# Patient Record
Sex: Male | Born: 1990 | Race: White | Hispanic: No | Marital: Single | Smoking: Never smoker
Health system: Southern US, Community
[De-identification: ages and names within clinical notes are randomized; demographics above are authoritative.]

## PROBLEM LIST (undated history)

## (undated) DIAGNOSIS — F3181 Bipolar II disorder: Secondary | ICD-10-CM

---
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
## 2018-08-11 ENCOUNTER — Other Ambulatory Visit: Payer: Self-pay

## 2018-08-11 ENCOUNTER — Emergency Department (HOSPITAL_COMMUNITY)
Admission: EM | Admit: 2018-08-11 | Discharge: 2018-08-12 | Disposition: A | Discharge status: Other Acute Inpatient Hospital | Payer: Self-pay | Attending: Emergency Medicine | Admitting: Emergency Medicine

## 2018-08-11 ENCOUNTER — Encounter (HOSPITAL_COMMUNITY): Payer: Self-pay | Admitting: Emergency Medicine

## 2018-08-11 DIAGNOSIS — F314 Bipolar disorder, current episode depressed, severe, without psychotic features: Secondary | ICD-10-CM

## 2018-08-11 DIAGNOSIS — F3181 Bipolar II disorder: Secondary | ICD-10-CM | POA: Insufficient documentation

## 2018-08-11 DIAGNOSIS — Y908 Blood alcohol level of 240 mg/100 ml or more: Secondary | ICD-10-CM | POA: Insufficient documentation

## 2018-08-11 DIAGNOSIS — R45851 Suicidal ideations: Secondary | ICD-10-CM

## 2018-08-11 DIAGNOSIS — F10920 Alcohol use, unspecified with intoxication, uncomplicated: Secondary | ICD-10-CM

## 2018-08-11 HISTORY — DX: Bipolar II disorder: F31.81

## 2018-08-11 NOTE — ED Triage Notes (Signed)
Patient here with possible overdose.  He is very confrontational with nursing staff.  He was brought in by family friends.  He states he does not know why he was brought to the ED.  He does have ETOH on board at this time.  He states that he has taken his regular dosage of meds.

## 2018-08-11 NOTE — ED Notes (Signed)
Pts friends at bedside who brought patient to the ED handed this nurse a cell phone with 2 males on the line, the male friends state they have been in conversation with the patient for several weeks, pt has been upset for last few days re a new dx of Bipolar disorder and is having a very hard time with same. Pt called friends tonight repeated "I cant do this" multiple times to friends with casual mention of overdosing. Pt then hung up and they were not able to get back in touch with patient.  They got in touch with adults in home and subsequently found patient passed out on his bedroom floor having consumed 1 and a half double bottles of wine.  Count on Cymbalta and Seroquel in bottles correct.

## 2018-08-11 NOTE — ED Provider Notes (Signed)
[facility] EMERGENCY DEPARTMENT Provider Note   CSN: [id number] Arrival date & time: 08/11/18  2319    History   Chief Complaint Chief Complaint  Patient presents with  . Drug Overdose    HPI Wyatt Ward is a 28 y.o. male.   The history is provided by the patient.  Drug Overdose   He has a possible history of bipolar disorder and was brought in by family that he is staying with because they were concerned about him.  This past week, he had 1 day where he was excessively happy and talkative, and then got very depressed.  There is secondhand information that he had told some friends that he was going to take a lot of pills.  He does admit to drinking a bottle of wine today, and most days.  He denies any suicidal thoughts and so states that he has been taking medication which was prescribed for him.  He had also been taking doxylamine as a sleep aid and apparently had gone through a bottle of 90 over the past 3weeks.  He had been seen at [facility] where he was diagnosed as possible bipolar 2 disorder.  He denies any suicidal thoughts but states that everything is random and trying to make sense of it is mental suicide.  He denies hallucinations and denies paranoid ideation.  He denies illicit drug use.  The family who he has been staying with stating he did not feel comfortable taking him home.  They state they took him in about 3 weeks ago because he had lost his job and broken up with his girlfriend and seemed very down on himself at that time.  Past Medical History:  Diagnosis Date  . Bipolar 2 disorder (HCC)     There are no active problems to display for this patient.   ** The histories are not reviewed yet. Please review them in the "History" navigator section and refresh this SmartLink.      Home Medications    Prior to Admission medications   Not on File    Family History No family history on file.  Social History Social History   Tobacco Use    . Smoking status: Never Smoker  . Smokeless tobacco: Never Used  Substance Use Topics  . Alcohol use: Yes  . Drug use: Never     Allergies   Patient has no known allergies.   Review of Systems Review of Systems  All other systems reviewed and are negative.    Physical Exam Updated Vital Signs BP (!) 147/105   Pulse 92   Temp 97.6 F (36.4 C) (Oral)   Resp 17   Ht 5\' 9"  (1.753 m)   Wt 68 kg   SpO2 97%   BMI 22.15 kg/m   Physical Exam Vitals signs and nursing note reviewed.    28 year old male, resting comfortably and in no acute distress. Vital signs are significant for elevated blood pressure. Oxygen saturation is 97%, which is normal. Head is normocephalic and atraumatic. PERRLA, EOMI. Oropharynx is clear. Neck is nontender and supple without adenopathy or JVD. Back is nontender and there is no CVA tenderness. Lungs are clear without rales, wheezes, or rhonchi. Chest is nontender. Heart has regular rate and rhythm without murmur. Abdomen is soft, flat, nontender without masses or hepatosplenomegaly and peristalsis is normoactive. Extremities have no cyanosis or edema, full range of motion is present. Skin is warm and dry without rash. Neurologic: Awake and alert,  strong odor of ethanol on his breath and he does appear to be intoxicated, cranial nerves are intact, there are no motor or sensory deficits.  ED Treatments / Results  Labs (all labs ordered are listed, but only abnormal results are displayed) Labs Reviewed  COMPREHENSIVE METABOLIC PANEL - Abnormal; Notable for the following components:      Result Value   Glucose, Bld 115 (*)    All other components within normal limits  ACETAMINOPHEN LEVEL - Abnormal; Notable for the following components:   Acetaminophen (Tylenol), Serum <10 (*)    All other components within normal limits  ETHANOL - Abnormal; Notable for the following components:   Alcohol, Ethyl (B) 334 (*)    All other components within  normal limits  CBC WITH DIFFERENTIAL/PLATELET - Abnormal; Notable for the following components:   RDW 11.1 (*)    All other components within normal limits  SALICYLATE LEVEL  RAPID URINE DRUG SCREEN, HOSP PERFORMED    EKG EKG Interpretation  Date/Time:  Saturday August 11 2018 23:41:17 EST Ventricular Rate:  96 PR Interval:    QRS Duration: 105 QT Interval:  295 QTC Calculation: 373 R Axis:   127 Text Interpretation:  Sinus rhythm Prolonged PR interval Consider right atrial enlargement Right axis deviation RSR' in V1 or V2, probably normal variant Early repolarization Baseline wander No old tracing to compare Confirmed by Dione Booze ([id number]) on 08/12/2018 12:06:58 AM   Procedures Procedures  Medications Ordered in ED Medications  acetaminophen (TYLENOL) tablet 650 mg (has no administration in time range)  ondansetron (ZOFRAN) tablet 4 mg (has no administration in time range)  alum & mag hydroxide-simeth (MAALOX/MYLANTA) 200-200-20 MG/5ML suspension 30 mL (has no administration in time range)     Initial Impression / Assessment and Plan / ED Course  I have reviewed the triage vital signs and the nursing notes.  Pertinent labs & imaging results that were available during my care of the patient were reviewed by me and considered in my medical decision making (see chart for details).  Depression which is probably part of bipolar disorder.  Doubt significant overdose.  Screening labs are obtained and will need to obtain TTS consultation.  ECG shows no arrhythmia or varus widening.  Old records were reviewed, and he has no relevant past visits.  Labs are significant for elevated ethanol level.  TTS consultation is appreciated.  Patient meets criteria for inpatient care.  He is not willing to sign himself in, so IVC paperwork is filled out and submitted.  Final Clinical Impressions(s) / ED Diagnoses   Final diagnoses:  Suicidal ideation  Bipolar disorder, current episode  depressed, severe, without psychotic features (HCC)  Alcohol intoxication, uncomplicated [facility])    ED Discharge Orders    None       Dione Booze, MD 08/12/18 [phone]

## 2018-08-12 ENCOUNTER — Other Ambulatory Visit: Payer: Self-pay

## 2018-08-12 ENCOUNTER — Encounter (HOSPITAL_COMMUNITY): Payer: Self-pay | Admitting: *Deleted

## 2018-08-12 ENCOUNTER — Inpatient Hospital Stay (HOSPITAL_COMMUNITY)
Admission: AD | Admit: 2018-08-12 | Discharge: 2018-08-15 | DRG: 885 | Disposition: A | Discharge status: Home / Self Care | Payer: No Typology Code available for payment source | Source: Intra-hospital | Attending: Psychiatry | Admitting: Psychiatry

## 2018-08-12 DIAGNOSIS — F411 Generalized anxiety disorder: Secondary | ICD-10-CM

## 2018-08-12 DIAGNOSIS — Y908 Blood alcohol level of 240 mg/100 ml or more: Secondary | ICD-10-CM | POA: Diagnosis present

## 2018-08-12 DIAGNOSIS — G4700 Insomnia, unspecified: Secondary | ICD-10-CM | POA: Diagnosis present

## 2018-08-12 DIAGNOSIS — F1010 Alcohol abuse, uncomplicated: Secondary | ICD-10-CM | POA: Diagnosis present

## 2018-08-12 DIAGNOSIS — R45851 Suicidal ideations: Secondary | ICD-10-CM | POA: Diagnosis present

## 2018-08-12 DIAGNOSIS — F3181 Bipolar II disorder: Principal | ICD-10-CM

## 2018-08-12 DIAGNOSIS — Z818 Family history of other mental and behavioral disorders: Secondary | ICD-10-CM | POA: Diagnosis not present

## 2018-08-12 DIAGNOSIS — Z79899 Other long term (current) drug therapy: Secondary | ICD-10-CM | POA: Diagnosis not present

## 2018-08-12 DIAGNOSIS — F319 Bipolar disorder, unspecified: Secondary | ICD-10-CM | POA: Insufficient documentation

## 2018-08-12 LAB — CBC WITH DIFFERENTIAL/PLATELET
Abs Immature Granulocytes: 0.01 10*3/uL (ref 0.00–0.07)
BASOS ABS: 0.1 10*3/uL (ref 0.0–0.1)
BASOS PCT: 1 %
EOS ABS: 0.3 10*3/uL (ref 0.0–0.5)
EOS PCT: 7 %
HCT: 46.8 % (ref 39.0–52.0)
Hemoglobin: 16.1 g/dL (ref 13.0–17.0)
Immature Granulocytes: 0 %
Lymphocytes Relative: 41 %
Lymphs Abs: 2 10*3/uL (ref 0.7–4.0)
MCH: 31.7 pg (ref 26.0–34.0)
MCHC: 34.4 g/dL (ref 30.0–36.0)
MCV: 92.1 fL (ref 80.0–100.0)
Monocytes Absolute: 0.2 10*3/uL (ref 0.1–1.0)
Monocytes Relative: 5 %
NRBC: 0 % (ref 0.0–0.2)
Neutro Abs: 2.3 10*3/uL (ref 1.7–7.7)
Neutrophils Relative %: 46 %
PLATELETS: 225 10*3/uL (ref 150–400)
RBC: 5.08 MIL/uL (ref 4.22–5.81)
RDW: 11.1 % — AB (ref 11.5–15.5)
WBC: 4.9 10*3/uL (ref 4.0–10.5)

## 2018-08-12 LAB — RAPID URINE DRUG SCREEN, HOSP PERFORMED
Amphetamines: NOT DETECTED
BARBITURATES: NOT DETECTED
Benzodiazepines: NOT DETECTED
COCAINE: NOT DETECTED
OPIATES: NOT DETECTED
Tetrahydrocannabinol: NOT DETECTED

## 2018-08-12 LAB — COMPREHENSIVE METABOLIC PANEL
ALT: 18 U/L (ref 0–44)
AST: 31 U/L (ref 15–41)
Albumin: 4.6 g/dL (ref 3.5–5.0)
Alkaline Phosphatase: 63 U/L (ref 38–126)
Anion gap: 12 (ref 5–15)
BUN: 7 mg/dL (ref 6–20)
CO2: 26 mmol/L (ref 22–32)
Calcium: 9.1 mg/dL (ref 8.9–10.3)
Chloride: 105 mmol/L (ref 98–111)
Creatinine, Ser: 0.88 mg/dL (ref 0.61–1.24)
Glucose, Bld: 115 mg/dL — ABNORMAL HIGH (ref 70–99)
POTASSIUM: 4 mmol/L (ref 3.5–5.1)
SODIUM: 143 mmol/L (ref 135–145)
Total Bilirubin: 0.7 mg/dL (ref 0.3–1.2)
Total Protein: 7.4 g/dL (ref 6.5–8.1)

## 2018-08-12 LAB — ACETAMINOPHEN LEVEL

## 2018-08-12 LAB — ETHANOL: ALCOHOL ETHYL (B): 334 mg/dL — AB (ref <10)

## 2018-08-12 LAB — SALICYLATE LEVEL

## 2018-08-12 LAB — TSH: TSH: 2.551 u[IU]/mL (ref 0.350–4.500)

## 2018-08-12 MED ORDER — ONDANSETRON HCL 4 MG PO TABS: 4.0000 mg | ORAL_TABLET | Freq: Three times a day (TID) | ORAL | Status: DC | PRN

## 2018-08-12 MED ORDER — ZIPRASIDONE MESYLATE 20 MG IM SOLR: 20.0000 mg | Freq: Once | INTRAMUSCULAR | Status: DC

## 2018-08-12 MED ORDER — ALUM & MAG HYDROXIDE-SIMETH 200-200-20 MG/5ML PO SUSP: 30.0000 mL | ORAL | Status: DC | PRN

## 2018-08-12 MED ORDER — DULOXETINE HCL 60 MG PO CPEP
60.0000 mg | ORAL_CAPSULE | Freq: Every day | ORAL | Status: DC
  Administered 2018-08-13 – 2018-08-15 (×3): 60 mg via ORAL
  Filled 2018-08-12 (×4): qty 1
  Filled 2018-08-12: qty 6

## 2018-08-12 MED ORDER — ONDANSETRON 4 MG PO TBDP: 4.0000 mg | ORAL_TABLET | Freq: Four times a day (QID) | ORAL | Status: DC | PRN

## 2018-08-12 MED ORDER — MAGNESIUM HYDROXIDE 400 MG/5ML PO SUSP: 30.0000 mL | Freq: Every day | ORAL | Status: DC | PRN

## 2018-08-12 MED ORDER — ADULT MULTIVITAMIN W/MINERALS CH
1.0000 | ORAL_TABLET | Freq: Every day | ORAL | Status: DC
  Administered 2018-08-12 – 2018-08-15 (×4): 1 via ORAL
  Filled 2018-08-12 (×2): qty 1
  Filled 2018-08-12: qty 7
  Filled 2018-08-12 (×4): qty 1

## 2018-08-12 MED ORDER — ACETAMINOPHEN 325 MG PO TABS: 650.0000 mg | ORAL_TABLET | Freq: Four times a day (QID) | ORAL | Status: DC | PRN

## 2018-08-12 MED ORDER — ACETAMINOPHEN 325 MG PO TABS: 650.0000 mg | ORAL_TABLET | ORAL | Status: DC | PRN

## 2018-08-12 MED ORDER — QUETIAPINE FUMARATE 25 MG PO TABS: 25.0000 mg | ORAL_TABLET | Freq: Every day | ORAL | Status: DC

## 2018-08-12 MED ORDER — DULOXETINE HCL 60 MG PO CPEP
60.0000 mg | ORAL_CAPSULE | Freq: Every day | ORAL | Status: DC
  Administered 2018-08-12: 60 mg via ORAL
  Filled 2018-08-12: qty 1

## 2018-08-12 MED ORDER — CLONAZEPAM 0.5 MG PO TABS
0.5000 mg | ORAL_TABLET | Freq: Three times a day (TID) | ORAL | Status: DC | PRN
  Administered 2018-08-12 – 2018-08-13 (×3): 0.5 mg via ORAL
  Filled 2018-08-12 (×3): qty 1

## 2018-08-12 MED ORDER — HYDROXYZINE HCL 25 MG PO TABS
25.0000 mg | ORAL_TABLET | Freq: Three times a day (TID) | ORAL | Status: DC | PRN
  Administered 2018-08-13: 25 mg via ORAL
  Filled 2018-08-12: qty 1

## 2018-08-12 MED ORDER — ALUM & MAG HYDROXIDE-SIMETH 200-200-20 MG/5ML PO SUSP: 30.0000 mL | Freq: Four times a day (QID) | ORAL | Status: DC | PRN

## 2018-08-12 MED ORDER — LORAZEPAM 1 MG PO TABS: 1.0000 mg | ORAL_TABLET | Freq: Four times a day (QID) | ORAL | Status: DC | PRN

## 2018-08-12 MED ORDER — LORAZEPAM 1 MG PO TABS
1.0000 mg | ORAL_TABLET | Freq: Once | ORAL | Status: AC
Start: 2018-08-12 — End: 2018-08-12
  Administered 2018-08-12: 1 mg via ORAL
  Filled 2018-08-12: qty 1

## 2018-08-12 MED ORDER — QUETIAPINE FUMARATE 25 MG PO TABS
25.0000 mg | ORAL_TABLET | Freq: Every day | ORAL | Status: DC
  Administered 2018-08-12 – 2018-08-14 (×3): 25 mg via ORAL
  Filled 2018-08-12 (×2): qty 1
  Filled 2018-08-12: qty 7
  Filled 2018-08-12 (×2): qty 1

## 2018-08-12 MED ORDER — LOPERAMIDE HCL 2 MG PO CAPS: 2.0000 mg | ORAL_CAPSULE | ORAL | Status: DC | PRN

## 2018-08-12 NOTE — Progress Notes (Addendum)
Patient ID: Wyatt Ward, male   DOB: [birth year], 28 y.o.   MRN: [id number] Patient is a very anxious 28 yo [place] man who is admitted involuntarily to [facility] today- for the first time_ after being found unresponsive by his friends ( he got drunk and passed out, according to the patient)his BAL in ED was 334( after consuming 1 1/2 bottles of wine) . He reports he was recently diagnosed with bipolar, that  he has had a very hard time accepting this because according to the -patient " mental illness is such a stigma". At the time of admission, he presents almost tearful, explaining to this Clinical research associate that he never was suicidal, that he accepts he needs help but that he is more concerned and worried about being free to have telelphone interview tomorrow, with [employer]  in [place](  potentially strong employer - to -be ). He reports he has a strong history of mental illness in his family, that , to his knowledge, no one in his family has ever suicided but he is able to identify his mother as family that has experienced MI ( she has MDD) in the past. He is able to contract with this wirter for safety, he denies acitve SI and is oriented to the unit.

## 2018-08-12 NOTE — ED Notes (Signed)
Pt beginning to calm down. States "its not as bad as I thought it would be over here". Pt given sprite and graham crackers. Pt is asking for his Seroquel, but pt has already had his medication for tonight. Will text md for something for anxiety and sleep. Pt very grateful and apologized for his behavior while in the other pod. Pt did get family to text his girlfriend while he was talking to them and states that helped him calm down and feel a lot better. Girlfriend is in [place]. Pt lives in [place] also. Came here to spend time with family after recent dx of bipolar. Has been on new medication for less than a week.

## 2018-08-12 NOTE — ED Notes (Signed)
Pt moved to purple zone room 49. Dressed in purple scrubs. Security, EMT and 2 RN's escorted pt to this area. Pt very anxious and tearful. 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 Requesting to be discharged. Pt aware that he has been IVC'd and states "I don't want to be here" "I'm not suicidal" "I know I shouldn't have said those things" "I don't want to die!"  States that he will stay here and do whatever he has to do, as long as he can somehow do this interview on Monday. Family took all belongings home, including phone. Pt wants to call girlfriend, but does not know her number. Will call family and see about getting phone numbers off phone or them bringing phone here (to do the same) when they come to see pt later today. All questions answered.

## 2018-08-12 NOTE — ED Notes (Signed)
Pt's HR noted to be elevated - pt noted to be tearful and anxious. States "I just need to let the dr know that I am ok and I just need to talk to my girlfriend". Pt voiced understanding of tx plan - accepted to [facility]. Pt remains cooperative.

## 2018-08-12 NOTE — ED Notes (Signed)
Breakfast tray at bedside 

## 2018-08-12 NOTE — ED Notes (Addendum)
Called pts family, Kathlene November and Karle Plumber, on pts phone. They did answer. Wanted to stress the importance of pts interview on Monday and that if he misses the interview, how pt will hold it against everyone that tried to get him help tonight. Instructed family and pt that what they are asking is against the rules, but will mention it to dayshift RN. Pt asked if he could speak to family on the phone. Phone given to pt to speak with family. Pt states that he just wants to speak with the counselor again.

## 2018-08-12 NOTE — ED Notes (Signed)
Pt at nurses' desk attempting to make phone call. 

## 2018-08-12 NOTE — ED Notes (Signed)
Security wanded patient 

## 2018-08-12 NOTE — [facility] Suicide Risk Assessment (Signed)
[facility] Admission Suicide Risk Assessment   Nursing information obtained from:    Demographic factors:    Current Mental Status:    Loss Factors:    Historical Factors:    Risk Reduction Factors:     Total Time spent with patient: 45 minutes Principal Problem: <principal problem not specified> Diagnosis:  Active Problems:   Bipolar 1 disorder (HCC)  Subjective Data: Patient is seen and examined.  Patient is a 28 year old male with a past psychiatric history significant for major depression who presented to the [facility] emergency department on 08/12/2018 with suicidal ideation.  The patient had recently moved from [place] back to [place] and was staying with the parents of a friend of his from [place].  The patient had fallen on difficult times in [place].  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  He had been there for approximately 2 years.  He stated he enjoyed his job at first, but then got bored with that for the repetitious notes of it all.  He began to have some depression including helplessness, hopelessness and worthlessness.  He saw a psychiatrist initially in [place], but switched.  He had been given Lexapro early on, and felt as though it helped to some degree.  There was a suggestion of augmenting the Lexapro with Abilify.  He was very concerned about starting an antipsychotic medication, and was dissatisfied and switched doctors at that time.  He was started on Cymbalta but he is unsure of the dose he was on initially.  He is currently taking 60 mg a day.  He had been dating a male, and at one point had moved in together in October 2019.  Unfortunately about the same time that he moved into live with a girlfriend he lost his job at [employer].  Over the last several months he was unable to get a job, and his anxiety and depression  worsen.  In approximately December 2019 his girlfriend announced that she was going to break up with him.  That he would move out.  He arranged to come to [facility] to stay with his friend's parents given his lack of employment and resources.  He arrived to their home on approximately Super Bowl Sunday in February 2020.  He was still taking the Cymbalta at that time.  The family he was staying with noticed some mood swings behaviors and that he would go from depressed to euphoric over a day or 2.  A time.  He stated he had really not had issues with mood swings prior to most recently.  They became concerned enough that they took him to the local mental health center.  He underwent a evaluation there, and they suggested that he had bipolar disorder type II.  They started him on 50 mg of Seroquel at bedtime.  He stated that he had never had severe episodes of euphoria, but the the parents of his friend had noticed these things.  He denied any previous history of excessive spending, being awake for 2 3 days at a time, or severe increases in goal-directed activity.  After he had been told that he had bipolar disorder type II he became significantly despondent over this.  His girlfriend had contacted him, and they decided that all they really needed was a break from one another, and they were trying to reconcile.  He actually has an interview at [facility] in [place] via video chat on March 2.  Because of the upsetting nature of receiving the diagnosis he began to drink excessively in the home.  The parents he was staying with are significantly religious, and did not want alcohol in the home.  He had not drank excessively until he had met with the people at [facility].  They came to check on him last evening, and his door was locked.  He did not want them to know that he was drinking.  They eventually got in the room and found the alcohol and were very upset about this, and took him to the emergency room.  He  admitted that he had had suicidal thoughts, but it never harmed himself in the past.  He stated that things to just gotten worse after he was told he had bipolar disorder.  He initially took Seroquel was that prescribed, but the 50 mg dose "knocked me out".  He was planning on reducing it to 25 mg.  In the emergency room he denied suicidal ideation, but because the nature of the admission the decision was made to transfer him to our facility for evaluation and stabilization.  Currently significantly anxious.  He is mildly labile.  He denied suicidal ideation.  He denied any history of DUIs or other excessive alcohol use or drug use.  His parents live in [place], but they are unaware that he is in [place] currently.  They are unaware of the fact that he is currently hospitalized.  Continued Clinical Symptoms:    The "Alcohol Use Disorders Identification Test", Guidelines for Use in Primary Care, Second Edition.  World Pharmacologist [facility]). Score between 0-7:  no or low risk or alcohol related problems. Score between 8-15:  moderate risk of alcohol related problems. Score between 16-19:  high risk of alcohol related problems. Score 20 or above:  warrants further diagnostic evaluation for alcohol dependence and treatment.   CLINICAL FACTORS:   Severe Anxiety and/or Agitation Bipolar Disorder:   Bipolar II Alcohol/Substance Abuse/Dependencies   Musculoskeletal: Strength & Muscle Tone: within normal limits Gait & Station: normal Patient leans: N/A  Psychiatric Specialty Exam: Physical Exam  Nursing note and vitals reviewed. Constitutional: He is oriented to person, place, and time. He appears well-developed and well-nourished.  HENT:  Head: Normocephalic and atraumatic.  Respiratory: Effort normal.  Neurological: He is alert and oriented to person, place, and time.    ROS  There were no vitals taken for this visit.There is no height or weight on file to calculate BMI.  General  Appearance: Disheveled  Eye Contact:  Fair  Speech:  Pressured  Volume:  Normal  Mood:  Anxious  Affect:  Congruent  Thought Process:  Coherent and Descriptions of Associations: Intact  Orientation:  Full (Time, Place, and Person)  Thought Content:  Logical  Suicidal Thoughts:  No  Homicidal Thoughts:  No  Memory:  Immediate;   Good Recent;   Good Remote;   Good  Judgement:  Intact  Insight:  Fair  Psychomotor Activity:  Increased  Concentration:  Concentration: Fair and Attention Span: Fair  Recall:  [employer] of Knowledge:  Good  Language:  Good  Akathisia:  Negative  Handed:  Right  AIMS (if indicated):     Assets:  Communication Skills Desire for Improvement Housing Intimacy Leisure Time Physical Health  ADL's:  Intact  Cognition:  WNL  Sleep:  COGNITIVE FEATURES THAT CONTRIBUTE TO RISK:  None    SUICIDE RISK:   Minimal: No identifiable suicidal ideation.  Patients presenting with no risk factors but with morbid ruminations; may be classified as minimal risk based on the severity of the depressive symptoms  PLAN OF CARE: Patient is seen and examined.  Patient is a 28 year old male with the above-stated past psychiatric history who was admitted for concern of suicidality, excessive alcohol use and the potential for bipolar disorder.  He will be admitted to the hospital.  He will be integrated into the milieu.  He will be encouraged to attend groups and work on his coping skills.  He denied any history of alcohol withdrawal symptoms, and really any excessive alcohol use prior to the last several days.  I have written for clonazepam 0.5 mg p.o. 3 times daily as needed for anxiety, but this should also cover in case he starts to have any alcohol withdrawal symptoms.  He has endorsed episodes of irritability and mood swings consistent with bipolar 2, but when he took Seroquel it hit him pretty hard.  We will continue the Seroquel but only 25 mg a day.  We will also  continue his Cymbalta at 60 mg p.o. daily.  He denied any onset of mood swings or manic type symptoms after starting the Cymbalta.  His symptoms of any form of mania or hypomania started while he was at his friend's parents house.  He is mildly labile, but denies suicidality currently.  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  He is seeking to be discharged prior to that, and I have not agreed to that at least at this point.  We will need additional collateral information.  He has been tachycardic in the emergency room, but is significantly anxious.  His laboratories were all essentially normal including his MCV and liver function enzymes except for his blood alcohol being at 334.  We will order thyroid function studies to be complete.  His EKG was mildly abnormal, but was thought to be sinus rhythm.  I certify that inpatient services furnished can reasonably be expected to improve the patient's condition.   Sharma Covert, MD 08/12/2018, 1:35 PM

## 2018-08-12 NOTE — ED Notes (Signed)
Breakfast tray ordered 

## 2018-08-12 NOTE — H&P (Signed)
Psychiatric Admission Assessment Adult  Patient Identification: Wyatt Ward MRN:  [id number] Date of Evaluation:  08/12/2018 Chief Complaint:  Bipolar II disorder Principal Diagnosis: <principal problem not specified> Diagnosis:  Active Problems:   Bipolar 1 disorder ([place])  History of Present Illness: Patient is seen and examined.  Patient is a 28 year old male with a past psychiatric history significant for major depression who presented to the [facility] emergency department on 08/12/2018 with suicidal ideation.  The patient had recently moved from [place] back to [place] and was staying with the parents of a friend of his from [place].  The patient had fallen on difficult times in [place].  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  He had been there for approximately 2 years.  He stated he enjoyed his job at first, but then got bored with that for the repetitious notes of it all.  He began to have some depression including helplessness, hopelessness and worthlessness.  He saw a psychiatrist initially in [place], but switched.  He had been given Lexapro early on, and felt as though it helped to some degree.  There was a suggestion of augmenting the Lexapro with Abilify.  He was very concerned about starting an antipsychotic medication, and was dissatisfied and switched doctors at that time.  He was started on Cymbalta but he is unsure of the dose he was on initially.  He is currently taking 60 mg a day.  He had been dating a male, and at one point had moved in together in October 2019.  Unfortunately about the same time that he moved into live with a girlfriend he lost his job at [employer].  Over the last several months he was unable to get a job, and his anxiety and depression worsen.  In approximately December 2019 his girlfriend announced  that she was going to break up with him.  That he would move out.  He arranged to come to [facility] to stay with his friend's parents given his lack of employment and resources.  He arrived to their home on approximately Super Bowl Sunday in February 2020.  He was still taking the Cymbalta at that time.  The family he was staying with noticed some mood swings behaviors and that he would go from depressed to euphoric over a day or 2.  A time.  He stated he had really not had issues with mood swings prior to most recently.  They became concerned enough that they took him to the local mental health center.  He underwent a evaluation there, and they suggested that he had bipolar disorder type II.  They started him on 50 mg of Seroquel at bedtime.  He stated that he had never had severe episodes of euphoria, but the the parents of his friend had noticed these things.  He denied any previous history of excessive spending, being awake for 2 3 days at a time, or severe increases in goal-directed activity.  After he had been told that he had bipolar disorder type II he became significantly despondent over this.  His girlfriend had contacted him, and they decided that all they really needed was a break from one another, and they were trying to reconcile.  He actually has an interview at [facility] in [place] via video chat on March 2.  Because of the upsetting nature of  receiving the diagnosis he began to drink excessively in the home.  The parents he was staying with are significantly religious, and did not want alcohol in the home.  He had not drank excessively until he had met with the people at [facility].  They came to check on him last evening, and his door was locked.  He did not want them to know that he was drinking.  They eventually got in the room and found the alcohol and were very upset about this, and took him to the emergency room.  He admitted that he had had suicidal thoughts, but it never harmed  himself in the past.  He stated that things to just gotten worse after he was told he had bipolar disorder.  He initially took Seroquel was that prescribed, but the 50 mg dose "knocked me out".  He was planning on reducing it to 25 mg.  In the emergency room he denied suicidal ideation, but because the nature of the admission the decision was made to transfer him to our facility for evaluation and stabilization.  Currently significantly anxious.  He is mildly labile.  He denied suicidal ideation.  He denied any history of DUIs or other excessive alcohol use or drug use.  His parents live in [place], but they are unaware that he is in [place] currently.  They are unaware of the fact that he is currently hospitalized.  Associated Signs/Symptoms: Depression Symptoms:  depressed mood, anhedonia, insomnia, psychomotor agitation, fatigue, feelings of worthlessness/guilt, difficulty concentrating, hopelessness, suicidal thoughts without plan, anxiety, loss of energy/fatigue, disturbed sleep, (Hypo) Manic Symptoms:  Elevated Mood, Impulsivity, Irritable Mood, Labiality of Mood, Anxiety Symptoms:  Excessive Worry, Psychotic Symptoms:  denied PTSD Symptoms: Negative Total Time spent with patient: 1 hour  Past Psychiatric History: Patient has no previous psychiatric admissions.  He has seen a psychiatrist in [place].  He saw 2 people there.  He was previously prescribed Lexapro and Cymbalta.  He had been prescribed Abilify for mood stability, but never took it.  No previous suicide attempts.  No previous DUIs.  Is the patient at risk to self? No.  Has the patient been a risk to self in the past 6 months? No.  Has the patient been a risk to self within the distant past? No.  Is the patient a risk to others? No.  Has the patient been a risk to others in the past 6 months? No.  Has the patient been a risk to others within the distant past? No.   Prior Inpatient Therapy:   Prior Outpatient  Therapy:    Alcohol Screening:   Substance Abuse History in the last 12 months:  Yes.   Consequences of Substance Abuse: Medical Consequences:  Patient ended up in the emergency room this evening because of excessive alcohol use. Previous Psychotropic Medications: Yes  Psychological Evaluations: Yes  Past Medical History:  Past Medical History:  Diagnosis Date  . Bipolar 2 disorder ([place])    Family History: No family history on file. Family Psychiatric  History: Patient stated his mother has a history of depression. Tobacco Screening:   Social History:  Social History   Substance and Sexual Activity  Alcohol Use Yes     Social History   Substance and Sexual Activity  Drug Use Never    Additional Social History:                           Allergies:  No Known Allergies Lab Results:  Results for orders placed or performed during the hospital encounter of 08/11/18 (from the past 48 hour(s))  Urine rapid drug screen (hosp performed)     Status: None   Collection Time: 08/12/18 12:42 AM  Result Value Ref Range   Opiates NONE DETECTED NONE DETECTED   Cocaine NONE DETECTED NONE DETECTED   Benzodiazepines NONE DETECTED NONE DETECTED   Amphetamines NONE DETECTED NONE DETECTED   Tetrahydrocannabinol NONE DETECTED NONE DETECTED   Barbiturates NONE DETECTED NONE DETECTED    Comment: (NOTE) DRUG SCREEN FOR MEDICAL PURPOSES ONLY.  IF CONFIRMATION IS NEEDED FOR ANY PURPOSE, NOTIFY LAB WITHIN 5 DAYS. LOWEST DETECTABLE LIMITS FOR URINE DRUG SCREEN Drug Class                     Cutoff (ng/mL) Amphetamine and metabolites    1000 Barbiturate and metabolites    200 Benzodiazepine                 295 Tricyclics and metabolites     300 Opiates and metabolites        300 Cocaine and metabolites        300 THC                            50 Performed at [facility], [place] [address], [place], [place] [address]   Comprehensive metabolic panel     Status: Abnormal    Collection Time: 08/12/18 12:47 AM  Result Value Ref Range   Sodium 143 135 - 145 mmol/L   Potassium 4.0 3.5 - 5.1 mmol/L   Chloride 105 98 - 111 mmol/L   CO2 26 22 - 32 mmol/L   Glucose, Bld 115 (H) 70 - 99 mg/dL   BUN 7 6 - 20 mg/dL   Creatinine, Ser 0.88 0.61 - 1.24 mg/dL   Calcium 9.1 8.9 - 10.3 mg/dL   Total Protein 7.4 6.5 - 8.1 g/dL   Albumin 4.6 3.5 - 5.0 g/dL   AST 31 15 - 41 U/L   ALT 18 0 - 44 U/L   Alkaline Phosphatase 63 38 - 126 U/L   Total Bilirubin 0.7 0.3 - 1.2 mg/dL   GFR calc non Af Amer >60 >60 mL/min   GFR calc Af Amer >60 >60 mL/min   Anion gap 12 5 - 15    Comment: Performed at [facility] [address] [place], [place] [address]  Acetaminophen level     Status: Abnormal   Collection Time: 08/12/18 12:47 AM  Result Value Ref Range   Acetaminophen (Tylenol), Serum <10 (L) 10 - 30 ug/mL    Comment: Performed at [facility] [address] [place], [place] [address]  Salicylate level     Status: None   Collection Time: 08/12/18 12:47 AM  Result Value Ref Range   Salicylate Lvl <3.6 2.8 - 30.0 mg/dL    Comment: Performed at [facility] [address], [place], [place] [address]  Ethanol     Status: Abnormal   Collection Time: 08/12/18 12:47 AM  Result Value Ref Range   Alcohol, Ethyl (B) 334 (HH) <10 mg/dL    Comment: CRITICAL RESULT CALLED TO, READ BACK BY AND VERIFIED WITH: MUNNETT [facility] 08/12/18 0126 WAYK Performed at [facility], [place] [address] [place], [place] [address]   CBC with Differential  Status: Abnormal   Collection Time: 08/12/18 12:47 AM  Result Value Ref Range   WBC 4.9 4.0 - 10.5 K/uL   RBC 5.08 4.22 - 5.81 MIL/uL   Hemoglobin 16.1 13.0 - 17.0 g/dL   HCT 46.8 39.0 - 52.0 %   MCV 92.1 80.0 - 100.0 fL   MCH 31.7 26.0 - 34.0 pg   MCHC 34.4 30.0 - 36.0 g/dL   RDW 11.1 (L) 11.5 - 15.5 %   Platelets 225 150 - 400 K/uL   nRBC 0.0 0.0 - 0.2 %   Neutrophils Relative % 46 %   Neutro Abs 2.3 1.7 - 7.7  K/uL   Lymphocytes Relative 41 %   Lymphs Abs 2.0 0.7 - 4.0 K/uL   Monocytes Relative 5 %   Monocytes Absolute 0.2 0.1 - 1.0 K/uL   Eosinophils Relative 7 %   Eosinophils Absolute 0.3 0.0 - 0.5 K/uL   Basophils Relative 1 %   Basophils Absolute 0.1 0.0 - 0.1 K/uL   Immature Granulocytes 0 %   Abs Immature Granulocytes 0.01 0.00 - 0.07 K/uL    Comment: Performed at [facility], [address], [place], [place] [address]    Blood Alcohol level:  Lab Results  Component Value Date   ETH 334 [facility]) 75/03/2584    Metabolic Disorder Labs:  No results found for: HGBA1C, MPG No results found for: PROLACTIN No results found for: CHOL, TRIG, HDL, CHOLHDL, VLDL, LDLCALC  Current Medications: Current Facility-Administered Medications  Medication Dose Route Frequency Provider Last Rate Last Dose  . acetaminophen (TYLENOL) tablet 650 mg  650 mg Oral Q6H PRN Sharma Covert, MD      . alum & mag hydroxide-simeth (MAALOX/MYLANTA) 200-200-20 MG/5ML suspension 30 mL  30 mL Oral Q4H PRN Sharma Covert, MD      . clonazePAM Bobbye [place]) tablet 0.5 mg  0.5 mg Oral TID PRN Sharma Covert, MD   0.5 mg at 08/12/18 1337  . [START ON 08/13/2018] DULoxetine (CYMBALTA) DR capsule 60 mg  60 mg Oral Daily Sharma Covert, MD      . hydrOXYzine (ATARAX/VISTARIL) tablet 25 mg  25 mg Oral TID PRN Sharma Covert, MD      . loperamide (IMODIUM) capsule 2-4 mg  2-4 mg Oral PRN Sharma Covert, MD      . magnesium hydroxide (MILK OF MAGNESIA) suspension 30 mL  30 mL Oral Daily PRN Sharma Covert, MD      . multivitamin with minerals tablet 1 tablet  1 tablet Oral Daily Sharma Covert, MD   1 tablet at 08/12/18 1338  . ondansetron (ZOFRAN-ODT) disintegrating tablet 4 mg  4 mg Oral Q6H PRN Sharma Covert, MD      . QUEtiapine (SEROQUEL) tablet 25 mg  25 mg Oral QHS Sharma Covert, MD       PTA Medications: Medications Prior to Admission  Medication Sig Dispense Refill Last Dose   . DULoxetine (CYMBALTA) 60 MG capsule Take 60 mg by mouth daily.   08/11/2018 at am  . QUEtiapine (SEROQUEL) 50 MG tablet Take 50 mg by mouth at bedtime.   08/11/2018 at bedtime    Musculoskeletal: Strength & Muscle Tone: Ability to identify changes in lifestyle to reduce recurrence of condition will improve, Ability to verbalize feelings will improve, Ability to disclose and discuss suicidal ideas, Ability to demonstrate self-control will improve, Ability to identify and develop effective coping behaviors will improve and Ability to  maintain clinical measurements within normal limits will improve Gait & Station: normal Patient leans: N/A  Psychiatric Specialty Exam: Physical Exam  Nursing note and vitals reviewed. Constitutional: He is oriented to person, place, and time. He appears well-developed and well-nourished.  HENT:  Head: Normocephalic and atraumatic.  Respiratory: Effort normal.  Neurological: He is alert and oriented to person, place, and time.    ROS  There were no vitals taken for this visit.There is no height or weight on file to calculate BMI.  General Appearance: Disheveled  Eye Contact:  Good  Speech:  Pressured  Volume:  Normal  Mood:  Anxious  Affect:  Congruent  Thought Process:  Coherent and Descriptions of Associations: Intact  Orientation:  Full (Time, Place, and Person)  Thought Content:  Logical  Suicidal Thoughts:  No  Homicidal Thoughts:  No  Memory:  Immediate;   Good Recent;   Good Remote;   Good  Judgement:  Intact  Insight:  Fair  Psychomotor Activity:  Increased  Concentration:  Concentration: Fair and Attention Span: Fair  Recall:  Good  Fund of Knowledge:  Good  Language:  Good  Akathisia:  Negative  Handed:  Right  AIMS (if indicated):     Assets:  Communication Skills Desire for Improvement Housing Leisure Time Physical Health Resilience  ADL's:  Intact  Cognition:  WNL  Sleep:       Treatment Plan Summary: Daily contact  with patient to assess and evaluate symptoms and progress in treatment, Medication management and Plan Patient is seen and examined.  Patient is a 28 year old male with the above-stated past psychiatric history who was admitted for concern of suicidality, excessive alcohol use and the potential for bipolar disorder.  He will be admitted to the hospital.  He will be integrated into the milieu.  He will be encouraged to attend groups and work on his coping skills.  He denied any history of alcohol withdrawal symptoms, and really any excessive alcohol use prior to the last several days.  I have written for clonazepam 0.5 mg p.o. 3 times daily as needed for anxiety, but this should also cover in case he starts to have any alcohol withdrawal symptoms.  He has endorsed episodes of irritability and mood swings consistent with bipolar 2, but when he took Seroquel it hit him pretty hard.  We will continue the Seroquel but only 25 mg a day.  We will also continue his Cymbalta at 60 mg p.o. daily.  He denied any onset of mood swings or manic type symptoms after starting the Cymbalta.  His symptoms of any form of mania or hypomania started while he was at his friend's parents house.  He is mildly labile, but denies suicidality currently.  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  He is seeking to be discharged prior to that, and I have not agreed to that at least at this point.  We will need additional collateral information.  He has been tachycardic in the emergency room, but is significantly anxious.  His laboratories were all essentially normal including his MCV and liver function enzymes except for his blood alcohol being at 334.  We will order thyroid function studies to be complete.  His EKG was mildly abnormal, but was thought to be sinus rhythm.  Observation Level/Precautions:  15 minute checks  Laboratory:  Chemistry Profile  Psychotherapy:    Medications:    Consultations:     Discharge Concerns:  Estimated LOS:  Other:     Physician Treatment Plan for Primary Diagnosis: <principal problem not specified> Long Term Goal(s): Improvement in symptoms so as ready for discharge  Short Term Goals: Ability to identify changes in lifestyle to reduce recurrence of condition will improve, Ability to verbalize feelings will improve, Ability to disclose and discuss suicidal ideas, Ability to demonstrate self-control will improve, Ability to identify and develop effective coping behaviors will improve and Ability to maintain clinical measurements within normal limits will improve  Physician Treatment Plan for Secondary Diagnosis: Active Problems:   Bipolar 1 disorder ([place])  Long Term Goal(s): Improvement in symptoms so as ready for discharge  Short Term Goals: Ability to identify changes in lifestyle to reduce recurrence of condition will improve, Ability to verbalize feelings will improve, Ability to disclose and discuss suicidal ideas, Ability to demonstrate self-control will improve, Ability to identify and develop effective coping behaviors will improve and Ability to maintain clinical measurements within normal limits will improve  I certify that inpatient services furnished can reasonably be expected to improve the patient's condition.    Sharma Covert, MD 3/1/20201:49 PM

## 2018-08-12 NOTE — ED Notes (Addendum)
Pt insisting that he is not SI and wants to leave. Advised pt he is under IVC and [facility] is recommending Inpt. Pt is requesting to speak w/dr so "I can convince him that I don't need to stay". Advised pt of tx plan.

## 2018-08-12 NOTE — [facility] Group Notes (Signed)
Adult Psychoeducational Group Note  Date:  08/12/2018 Time:  9:28 PM  Group Topic/Focus:  Wrap-Up Group:   The focus of this group is to help patients review their daily goal of treatment and discuss progress on daily workbooks.  Participation Level:  Active  Participation Quality:  Appropriate and Attentive  Affect:  Appropriate  Cognitive:  Alert and Appropriate  Insight: Appropriate and Good  Engagement in Group:  Engaged  Modes of Intervention:  Discussion and Education  Additional Comments:  Pt attended and participated in wrap up group this evening. Pt rated their day an 8/10, due to them being IVC and reading a text of their girlfriend breaking up to them. Pt goal while they are here is to continue writing.   Chrisandra Netters 08/12/2018, 9:28 PM

## 2018-08-12 NOTE — ED Notes (Signed)
Pt has been accepted to [facility] [id number] - at 12pm.

## 2018-08-12 NOTE — Tx Team (Signed)
Initial Treatment Plan 08/12/2018 6:00 PM Lamareon Sport [id number]    PATIENT STRESSORS: Educational concerns Financial difficulties Health problems   PATIENT STRENGTHS: Ability for insight Active sense of humor Average or above average intelligence   PATIENT IDENTIFIED PROBLEMS: MDD with Bipolar D/o  Substance Abuse s/p OD attempt              "{ I really didn't want to die"  " I need to get out of here"   DISCHARGE CRITERIA:  Ability to meet basic life and health needs Adequate post-discharge living arrangements Improved stabilization in mood, thinking, and/or behavior  PRELIMINARY DISCHARGE PLAN: Attend aftercare/continuing care group Attend PHP/IOP Attend 12-step recovery group  PATIENT/FAMILY INVOLVEMENT: This treatment plan has been presented to and reviewed with the patient, Wyatt Ward, and/or family member, .  The patient and family have been given the opportunity to ask questions and make suggestions.  Rich Brave, RN 08/12/2018, 6:00 PM

## 2018-08-12 NOTE — ED Notes (Signed)
Lunch tray ordered at 0957. 

## 2018-08-12 NOTE — [facility] Group Notes (Signed)
[facility] Group Notes:  (Nursing/MHT/Case Management/Adjunct)  Date:  08/12/2018  Time:  6:35 PM  Type of Therapy:  Nurse Education  Participation Level:  Active  Participation Quality:  Attentive  Affect:  Appropriate  Cognitive:  Alert and Appropriate  Insight:  Good  Engagement in Group:  Engaged  Modes of Intervention:  Activity and Discussion  Summary of Progress/Problems: In this group, the RN reviewed healthy coping strategies. The patients were taught why they struggle with symptoms of depression and how to work through them. The patient was cooperative and participated.  Kirstie Mirza 08/12/2018, 6:35 PM

## 2018-08-12 NOTE — BH Assessment (Addendum)
Tele Assessment Note   Patient Name: Brom [place] MRN: [id number] Referring Physician: Dione Booze, MD Location of Patient: [facility], [id number] Location of Provider: Behavioral Health TTS Department  Ronith Raper is an 28 y.o. single male who presents to [facility] ED accompanied by two friends, Kathlene November and Karle Plumber, who participated in assessment with Pt's consent. Pt says "I feel great! I feel wonderful!" Pt says to ask the Tomasa Blase why he is here. The Tomasa Blase say that Pt has appeared depressed and has not been behaving normally for the past several days. The Tomasa Blase report that tonight they received a call from Pt's friends stating they were concerned because Pt told them repeatedly "I can't do this" and casually mentioned overdosing. Pt then hung up and they were not able to get back in touch with him. Ralene Cork checked on Pt and discovered Pt was "passed out" on his bedroom floor. He appeared to have ingested a bottle and a half of wine and there was an empty bottle of non-prescription sleep aid and an empty bottle of melatonin. Per ED notes, the Pt's RN spoke via cell phone with two male friends who stated they have been in conversation with the Pt for several weeks and Pt has been upset for last few days re a new dx of Bipolar disorder and is having a very hard time with same.Pt's blood alcohol level is 334.    Pt appears angry and defensive during assessment despite repeatedly saying he feels "wonderful". Pt states that he "loves religious people because they are closest to suicide." Pt denies being religious. He says he has a history of depression and was prescribed Cymbalta by a psychiatrist in [place]. Pt says he was evaluated by a psychiatric nurse practioner three days ago at [facility] who diagnosed him with bipolar disorder and prescribed Seroquel. Pt insists he is not bipolar and is offended he was given this diagnosis. Pt repeatedly denies he is suicidal or that he attempted suicide  tonight. Pt denies any history of suicide attempts. He denies current homicidal ideation or history of violence. He denies auditory or visual hallucinations. Pt states he typically drinks one bottle of wine 3-4 times per week. He denies other substance use. Pt's urine drug screen is negative.   Pt states he recently broke up with his girlfriend. 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 Pt says he also had to get rid of his dog and Pt became tearful and upset when discussing this. He came to [place] and the Tomasa Blase took him in. Karle Plumber states the Pt had not been eating and appeared depressed. She says Pt began avoiding them earlier this week and then began stating things that were contradictory or not making sense. She says Pt's friends expressed concern due to Pt's mood and behavior and they took Pt to [facility]. Pt denies any history of inpatient psychiatric treatment. Pt denies history of abuse. He denies access to firearms.  Pt is dressed in hospital gown, alert and oriented x4. Pt speaks in a clear tone, at moderate volume and normal pace. Motor behavior appears normal. Eye contact is good. Pt describes his mood as wonderful but affect is labile. Thought process is coherent and at times circumstantial. There is no indication Pt is currently responding to internal stimuli or experiencing delusional thought content. Pt states he wants to leave.  During assessment Pt insisted that he didn't trust the Tomasa Blase and was no longer going to stay  with them. He said he was going on vacation and then returning to [place], although he has no money. Ralene Cork told Pt he agreed to Pt's wishes and that Pt could no longer stay with them. Pt then insisted he never said he didn't want to stay with them. Pt will not be able to return to the [place] residence.   Diagnosis: F31.81 Bipolar II disorder  Past Medical History:  Past Medical History:  Diagnosis Date  . Bipolar 2 disorder  (HCC)       Family History: No family history on file.  Social History:  reports that he has never smoked. He has never used smokeless tobacco. He reports current alcohol use. He reports that he does not use drugs.  Additional Social History:  Alcohol / Drug Use Pain Medications: Denies use Prescriptions: Denies abuse Over the Counter: Pt taking OTC sleeping medications and Melatonin History of alcohol / drug use?: Yes Longest period of sobriety (when/how long): Unknown Negative Consequences of Use: (Pt denies) Withdrawal Symptoms: (Pt denies) Substance #1 Name of Substance 1: Alcohol 1 - Age of First Use: unknown 1 - Amount (size/oz): Approximately one bottle of wine 1 - Frequency: 3-4 times per week 1 - Duration: Ongoing 1 - Last Use / Amount: 08/11/18  CIWA: CIWA-Ar BP: (!) 141/99 Pulse Rate: (!) 105 COWS:    Allergies: No Known Allergies  Home Medications: (Not in a hospital admission)   OB/GYN Status:  No LMP for male patient.  General Assessment Data Location of Assessment: [facility] ED TTS Assessment: In system Is this a Tele or Face-to-Face Assessment?: Tele Assessment Is this an Initial Assessment or a Re-assessment for this encounter?: Initial Assessment Patient Accompanied by:: Other(Friends) Language Other than English: No Living Arrangements: Other (Comment)(Staying with friends) What gender do you identify as?: Male Marital status: Single Maiden name: NA Pregnancy Status: No Living Arrangements: Non-relatives/Friends Can pt return to current living arrangement?: Yes Admission Status: Voluntary Is patient capable of signing voluntary admission?: Yes Referral Source: Self/Family/Friend Insurance type: Self-pay     Crisis Care Plan Living Arrangements: Non-relatives/Friends Legal Guardian: Other:(Self) Name of Psychiatrist: Transport planner Name of Therapist: [facility]  Education Status Is patient currently in school?: No Is the patient employed, unemployed  or receiving disability?: Unemployed  Risk to self with the past 6 months Suicidal Ideation: Yes-Currently Present Has patient been a risk to self within the past 6 months prior to admission? : Yes Suicidal Intent: No Has patient had any suicidal intent within the past 6 months prior to admission? : No Is patient at risk for suicide?: Yes Suicidal Plan?: Yes-Currently Present Has patient had any suicidal plan within the past 6 months prior to admission? : Other (comment) Specify Current Suicidal Plan: Pt has possibly overdosed on alcohol and OTC sleeping medications Access to Means: Yes Specify Access to Suicidal Means: Empty bottles of OTC sleeping medications found by friends What has been your use of drugs/alcohol within the last 12 months?: Pt reports drinking one bottle of win 3-4 times per week Previous Attempts/Gestures: No How many times?: 0 Other Self Harm Risks: None Triggers for Past Attempts: None known Intentional Self Injurious Behavior: None Family Suicide History: No Recent stressful life event(s): Financial Problems, Job Loss, Loss (Comment), Other (Comment)(Broke up with girlfriend, had to give up dog) Persecutory voices/beliefs?: No Depression: Yes Depression Symptoms: Insomnia, Tearfulness, Isolating, Feeling angry/irritable Substance abuse history and/or treatment for substance abuse?: No Suicide prevention information given to non-admitted patients: Not applicable  Risk  to Others within the past 6 months Homicidal Ideation: No Does patient have any lifetime risk of violence toward others beyond the six months prior to admission? : No Thoughts of Harm to Others: No Current Homicidal Intent: No Current Homicidal Plan: No Access to Homicidal Means: No Identified Victim: None History of harm to others?: No Assessment of Violence: None Noted Violent Behavior Description: Pt denies history of violence Does patient have access to weapons?: No Criminal Charges  Pending?: No Does patient have a court date: No Is patient on probation?: No  Psychosis Hallucinations: None noted Delusions: None noted  Mental Status Report Appearance/Hygiene: In scrubs Eye Contact: Good Motor Activity: Freedom of movement Speech: Logical/coherent Level of Consciousness: Alert Mood: Depressed, Anxious, Irritable, Other (Comment)(Pt says he feels "wonderful") Affect: Labile Anxiety Level: Minimal Thought Processes: Coherent, Relevant Judgement: Partial Orientation: Person, Place, Time, Situation Obsessive Compulsive Thoughts/Behaviors: None  Cognitive Functioning Concentration: Normal Memory: Recent Intact, Remote Intact Is patient IDD: No Insight: Poor Impulse Control: Fair Appetite: Fair Have you had any weight changes? : No Change Sleep: Decreased Total Hours of Sleep: 8(with medication) Vegetative Symptoms: None  ADLScreening [facility] Assessment Services) Patient's cognitive ability adequate to safely complete daily activities?: Yes Patient able to express need for assistance with ADLs?: Yes Independently performs ADLs?: Yes (appropriate for developmental age)  Prior Inpatient Therapy Prior Inpatient Therapy: No  Prior Outpatient Therapy Prior Outpatient Therapy: Yes Prior Therapy Dates: Current Prior Therapy Facilty/Provider(s): [facility] Reason for Treatment: Bipolar disorder Does patient have an ACCT team?: No Does patient have Intensive In-House Services?  : No Does patient have [facility] services? : Yes  ADL Screening (condition at time of admission) Patient's cognitive ability adequate to safely complete daily activities?: Yes Is the patient deaf or have difficulty hearing?: No Does the patient have difficulty seeing, even when wearing glasses/contacts?: No Does the patient have difficulty concentrating, remembering, or making decisions?: No Patient able to express need for assistance with ADLs?: Yes Does the patient have difficulty  dressing or bathing?: No Independently performs ADLs?: Yes (appropriate for developmental age) Does the patient have difficulty walking or climbing stairs?: No Weakness of Legs: None Weakness of Arms/Hands: None  Home Assistive Devices/Equipment Home Assistive Devices/Equipment: None    Abuse/Neglect Assessment (Assessment to be complete while patient is alone) Abuse/Neglect Assessment Can Be Completed: Yes Physical Abuse: Denies Verbal Abuse: Denies Sexual Abuse: Denies Exploitation of patient/patient's resources: Denies Self-Neglect: Denies     Merchant navy officer (For Healthcare) Does Patient Have a Medical Advance Directive?: No Would patient like information on creating a medical advance directive?: No - Patient declined          Disposition: Gave clinical report to Nira Conn, FNP who said Pt meets criteria for inpatient psychiatric treatment and accepted Pt to [facility]. Binnie Rail, [facility], said a bed is not currently available and the day shift AC will call [facility] when the bed is available. Notified Dr. Dione Booze and RN of recommendation. Dr. Preston Fleeting said he is petitioning for involuntary commitment.  Disposition Initial Assessment Completed for this Encounter: Yes  This service was provided via telemedicine using a 2-way, interactive audio and video technology.  Names of all persons participating in this telemedicine service and their role in this encounter. Name: [facility] Role: Patient  Name: Kathlene November and Karle Plumber Role: Friends  Name: Shela Commons, [facility] Role: TTS counselor      Harlin Rain Patsy Baltimore, [facility], [facility], [facility] Triage Specialist [phone]  Patsy Baltimore, Harlin Rain 08/12/2018 1:13 AM

## 2018-08-13 LAB — LIPID PANEL
Cholesterol: 203 mg/dL — ABNORMAL HIGH (ref 0–200)
HDL: 91 mg/dL (ref >40)
LDL Cholesterol: 98 mg/dL (ref 0–99)
Total CHOL/HDL Ratio: 2.2 RATIO
Triglycerides: 69 mg/dL (ref <150)
VLDL: 14 mg/dL (ref 0–40)

## 2018-08-13 LAB — HEMOGLOBIN A1C
HEMOGLOBIN A1C: 4.6 % — AB (ref 4.8–5.6)
Mean Plasma Glucose: 85.32 mg/dL

## 2018-08-13 MED ORDER — PROPRANOLOL HCL 10 MG PO TABS
10.0000 mg | ORAL_TABLET | Freq: Three times a day (TID) | ORAL | Status: DC
  Administered 2018-08-13 – 2018-08-15 (×6): 10 mg via ORAL
  Filled 2018-08-13: qty 21
  Filled 2018-08-13 (×2): qty 1
  Filled 2018-08-13: qty 21
  Filled 2018-08-13 (×9): qty 1
  Filled 2018-08-13: qty 21

## 2018-08-13 MED ORDER — CLONAZEPAM 1 MG PO TABS
1.0000 mg | ORAL_TABLET | Freq: Three times a day (TID) | ORAL | Status: DC | PRN
  Administered 2018-08-13 – 2018-08-15 (×6): 1 mg via ORAL
  Filled 2018-08-13 (×6): qty 1

## 2018-08-13 MED ORDER — OXCARBAZEPINE 150 MG PO TABS
75.0000 mg | ORAL_TABLET | Freq: Two times a day (BID) | ORAL | Status: DC
  Administered 2018-08-13 – 2018-08-15 (×5): 75 mg via ORAL
  Filled 2018-08-13 (×4): qty 0.5
  Filled 2018-08-13: qty 7
  Filled 2018-08-13 (×2): qty 0.5
  Filled 2018-08-13: qty 7
  Filled 2018-08-13: qty 0.5

## 2018-08-13 NOTE — Progress Notes (Signed)
D   Pt requested medications early this evening    He attended group and interacts minimally but appropriately with others    Pt voiced no further complaints at this time A    Verbal support given   Medications administered and effectiveness monitored  Q 15 min checks R   Pt is safe at this time

## 2018-08-13 NOTE — Progress Notes (Signed)
Patient ID: Wyatt Ward, male   DOB: 17-May-1991, 28 y.o.   MRN: [id number]  Primary nurse Drenda Freeze altered writer this patient was upset because his sister was in the waiting room with his personal journal which she had read. Patient tearful and visibly anxious stating, "it's a huge invasion of my privacy, it has personal stuff in there I don't want my parents knowing about". Patient states, "I am only supposed to date Muslims and they're going to find out I've dated others". Patient requested writer retrieve the journal from his sister in the lobby.  Upon retrieving the journal, patient's sister stated she was concerned because the journal "spoke in great detail about his suicidal thoughts" and she states she is concerned the patient is "not disclosing everything to the doctor". Sister states, "he said he was in over $40,000 in debt and even the writing style would change and get very erratic". Sister asks Clinical research associate to pass this on to treatment team.  Patient adamantly states he does not want staff to read his journal. Clinical research associate and primary nurse informed patient we would secure his journal into a locker for the length of his admission and he could have it upon discharge. Patient verbalized understanding. Journal placed into locker #7 and secured by security.

## 2018-08-13 NOTE — Progress Notes (Signed)
D:  Patient's self inventory sheet, patient sleeps good, no sleep medication.  Good appetite, low energy level, poor concentration. Rated depression, anxiety and hopeless #6.  Denied withdrawals.  Then checked runny nose . Denied SI.   Does have sinus cold.  Worst pain #9 in past 24 hours.  No pain medicine.  Goal is call her MD to reschedule appointment.   No discharge plans. A:  Medications administered per MD orders.  Emotional support and encouragement given patient. R:  Denied SI and HI, contracts for safety.  Denied A/V hallucinations.  Safety maintained with 15 minute checks.

## 2018-08-13 NOTE — Plan of Care (Signed)
Nurse discussed anxiety, depression, coping skills with patient. 

## 2018-08-13 NOTE — Progress Notes (Signed)
Patient's sister brought his private journal with his private thoughts and feelings and wanted to show staff what her brother had written.  Patient stated he did not want sister to read his journal and did not want journal read by staff.  Journal was given to nurse and placed in locker 7 for patient to get at discharge.   Patient's dad would like to be called tomorrow by SW/MD and discuss discharge date and where he will live after discharge at [facility].   Dad Hilmo Dyke phone [phone].

## 2018-08-13 NOTE — Progress Notes (Signed)
D: Patient observed up and active in milieu tonight. Spoke with MHT, RN about need for discharge tomorrow so that he can make his job interview via Nurse, adult. Patient noticeably anxious with pressured speech. Focused on discharge and does not verbalize insight into his current state and events PTA. Patient's affect animated, anxious, restless with congruent mood. Denies pain, physical complaints. Denies withdrawal, CIWA scored at a "7" due to agitation, anxiety. BP and pulse elevated (see doc flowsheets).    A: Medicated per orders, prn klonopin given for anxiety. Medication education provided. Erlene Quan, NP notified of VS and no new orders received. Explained to patient he would need to speak with the MD tomorrow regarding his desire to discharge. Level III obs in place for safety. Emotional support offered. Patient encouraged to complete Suicide Safety Plan before discharge (even though patient states he was not suicidal). Encouraged to attend and participate in unit programming.   R: Patient verbalizes understanding of POC. On reassess, patient slightly calmer, socializing with peers in dayroom. Patient denies SI/HI/AVH and remains safe on level III obs. Will continue to monitor throughout the night.

## 2018-08-13 NOTE — BHH Group Notes (Signed)
LCSW Group Therapy Note 08/13/2018 12:55 PM  Type of Therapy and Topic: Group Therapy: Overcoming Obstacles  Participation Level: Minimal  Description of Group:  In this group patients will be encouraged to explore what they see as obstacles to their own wellness and recovery. They will be guided to discuss their thoughts, feelings, and behaviors related to these obstacles. The group will process together ways to cope with barriers, with attention given to specific choices patients can make. Each patient will be challenged to identify changes they are motivated to make in order to overcome their obstacles. This group will be process-oriented, with patients participating in exploration of their own experiences as well as giving and receiving support and challenge from other group members.  Therapeutic Goals: 1. Patient will identify personal and current obstacles as they relate to admission. 2. Patient will identify barriers that currently interfere with their wellness or overcoming obstacles.  3. Patient will identify feelings, thought process and behaviors related to these barriers. 4. Patient will identify two changes they are willing to make to overcome these obstacles:   Summary of Patient Progress  Patient came to the group session during the last 5 minutes due to being with the MD. Wyatt Ward was attentive during his stay.    Therapeutic Modalities:  Cognitive Behavioral Therapy Solution Focused Therapy Motivational Interviewing Relapse Prevention Therapy   Alcario Drought Clinical Social Worker

## 2018-08-13 NOTE — Progress Notes (Signed)
Patient expressed in group that he wrote a great deal today in his journal. He also expressed that he spoke with his peers. He states that he had a good visit with his parents, but his brother cried.

## 2018-08-13 NOTE — Tx Team (Signed)
Interdisciplinary Treatment and Diagnostic Plan Update  08/13/2018 Time of Session: 9:30am Wyatt Ward MRN: [id number]  Principal Diagnosis: Bipolar 2 disorder [facility])  Secondary Diagnoses: Principal Problem:   Bipolar 2 disorder (HCC) Active Problems:   Alcohol abuse   Generalized anxiety disorder   Current Medications:  Current Facility-Administered Medications  Medication Dose Route Frequency Provider Last Rate Last Dose  . acetaminophen (TYLENOL) tablet 650 mg  650 mg Oral Q6H PRN Antonieta Pert, MD      . alum & mag hydroxide-simeth (MAALOX/MYLANTA) 200-200-20 MG/5ML suspension 30 mL  30 mL Oral Q4H PRN Antonieta Pert, MD      . clonazePAM Scarlette Calico) tablet 0.5 mg  0.5 mg Oral TID PRN Antonieta Pert, MD   0.5 mg at 08/12/18 2056  . DULoxetine (CYMBALTA) DR capsule 60 mg  60 mg Oral Daily Antonieta Pert, MD   60 mg at 08/13/18 0814  . hydrOXYzine (ATARAX/VISTARIL) tablet 25 mg  25 mg Oral TID PRN Antonieta Pert, MD   25 mg at 08/13/18 0739  . loperamide (IMODIUM) capsule 2-4 mg  2-4 mg Oral PRN Antonieta Pert, MD      . magnesium hydroxide (MILK OF MAGNESIA) suspension 30 mL  30 mL Oral Daily PRN Antonieta Pert, MD      . multivitamin with minerals tablet 1 tablet  1 tablet Oral Daily Antonieta Pert, MD   1 tablet at 08/13/18 [phone]  . ondansetron (ZOFRAN-ODT) disintegrating tablet 4 mg  4 mg Oral Q6H PRN Antonieta Pert, MD      . OXcarbazepine (TRILEPTAL) tablet 75 mg  75 mg Oral BID Antonieta Pert, MD   75 mg at 08/13/18 0936  . QUEtiapine (SEROQUEL) tablet 25 mg  25 mg Oral QHS Antonieta Pert, MD   25 mg at 08/12/18 2056   PTA Medications: Medications Prior to Admission  Medication Sig Dispense Refill Last Dose  . DULoxetine (CYMBALTA) 60 MG capsule Take 60 mg by mouth daily.   Unknown at Unknown time  . QUEtiapine (SEROQUEL) 50 MG tablet Take 50 mg by mouth at bedtime.   Unknown at Unknown time    Patient Stressors: Educational  concerns Financial difficulties Health problems  Patient Strengths: Ability for insight Active sense of humor Average or above average intelligence  Treatment Modalities: Medication Management, Group therapy, Case management,  1 to 1 session with clinician, Psychoeducation, Recreational therapy.   Physician Treatment Plan for Primary Diagnosis: Bipolar 2 disorder (HCC) Long Term Goal(s): Improvement in symptoms so as ready for discharge Improvement in symptoms so as ready for discharge   Short Term Goals: Ability to identify changes in lifestyle to reduce recurrence of condition will improve Ability to verbalize feelings will improve Ability to disclose and discuss suicidal ideas Ability to demonstrate self-control will improve Ability to identify and develop effective coping behaviors will improve Ability to maintain clinical measurements within normal limits will improve Ability to identify changes in lifestyle to reduce recurrence of condition will improve Ability to verbalize feelings will improve Ability to disclose and discuss suicidal ideas Ability to demonstrate self-control will improve Ability to identify and develop effective coping behaviors will improve Ability to maintain clinical measurements within normal limits will improve  Medication Management: Evaluate patient's response, side effects, and tolerance of medication regimen.  Therapeutic Interventions: 1 to 1 sessions, Unit Group sessions and Medication administration.  Evaluation of Outcomes: Progressing  Physician Treatment Plan for Secondary Diagnosis: Principal Problem:  Bipolar 2 disorder (HCC) Active Problems:   Alcohol abuse   Generalized anxiety disorder  Long Term Goal(s): Improvement in symptoms so as ready for discharge Improvement in symptoms so as ready for discharge   Short Term Goals: Ability to identify changes in lifestyle to reduce recurrence of condition will improve Ability to  verbalize feelings will improve Ability to disclose and discuss suicidal ideas Ability to demonstrate self-control will improve Ability to identify and develop effective coping behaviors will improve Ability to maintain clinical measurements within normal limits will improve Ability to identify changes in lifestyle to reduce recurrence of condition will improve Ability to verbalize feelings will improve Ability to disclose and discuss suicidal ideas Ability to demonstrate self-control will improve Ability to identify and develop effective coping behaviors will improve Ability to maintain clinical measurements within normal limits will improve     Medication Management: Evaluate patient's response, side effects, and tolerance of medication regimen.  Therapeutic Interventions: 1 to 1 sessions, Unit Group sessions and Medication administration.  Evaluation of Outcomes: Progressing   RN Treatment Plan for Primary Diagnosis: Bipolar 2 disorder (HCC) Long Term Goal(s): Knowledge of disease and therapeutic regimen to maintain health will improve  Short Term Goals: Ability to participate in decision making will improve, Ability to verbalize feelings will improve, Ability to disclose and discuss suicidal ideas and Ability to identify and develop effective coping behaviors will improve  Medication Management: RN will administer medications as ordered by provider, will assess and evaluate patient's response and provide education to patient for prescribed medication. RN will report any adverse and/or side effects to prescribing provider.  Therapeutic Interventions: 1 on 1 counseling sessions, Psychoeducation, Medication administration, Evaluate responses to treatment, Monitor vital signs and CBGs as ordered, Perform/monitor CIWA, COWS, AIMS and Fall Risk screenings as ordered, Perform wound care treatments as ordered.  Evaluation of Outcomes: Progressing   LCSW Treatment Plan for Primary  Diagnosis: Bipolar 2 disorder (HCC) Long Term Goal(s): Safe transition to appropriate next level of care at discharge, Engage patient in therapeutic group addressing interpersonal concerns.  Short Term Goals: Engage patient in aftercare planning with referrals and resources  Therapeutic Interventions: Assess for all discharge needs, 1 to 1 time with Social worker, Explore available resources and support systems, Assess for adequacy in community support network, Educate family and significant other(s) on suicide prevention, Complete Psychosocial Assessment, Interpersonal group therapy.  Evaluation of Outcomes: Progressing   Progress in Treatment: Attending groups: Yes. Participating in groups: Yes. Taking medication as prescribed: Yes. Toleration medication: Yes. Family/Significant other contact made: No, will contact:  the patient's sister Patient understands diagnosis: Yes. Discussing patient identified problems/goals with staff: Yes. Medical problems stabilized or resolved: Yes. Denies suicidal/homicidal ideation: Yes. Issues/concerns per patient self-inventory: No. Other:    New problem(s) identified: None   New Short Term/Long Term Goal(s): medication stabilization, elimination of SI thoughts, development of comprehensive mental wellness plan.    Patient Goals:  "To get better enough to leave"   Discharge Plan or Barriers: Patient reports he is discharging home to his parents home in [place], [place]. He reports he will continue to [facility] for outpatient medication management and therapy services. CSW will continue to follow for any additional referrals or discharge planning.   Reason for Continuation of Hospitalization: Anxiety Depression Medication stabilization  Estimated Length of Stay: 08/15/2018  Attendees: Patient: Wyatt Ward  08/13/2018 11:12 AM  Physician: Dr. Landry MellowGreg Clary, MD 08/13/2018 11:12 AM  Nursing: Meriam [email or web address], RN 08/13/2018 11:12 AM  RN Care  Manager: 08/13/2018 11:12 AM  Social Worker: Baldo Daub, LCSWA 08/13/2018 11:12 AM  Recreational Therapist:  08/13/2018 11:12 AM  Other: Marciano Sequin, NP  08/13/2018 11:12 AM  Other:  08/13/2018 11:12 AM  Other: 08/13/2018 11:12 AM    Scribe for Treatment Team: Maeola Sarah, LCSWA 08/13/2018 11:12 AM

## 2018-08-13 NOTE — [facility] Suicide Risk Assessment (Signed)
[facility] INPATIENT:  Family/Significant Other Suicide Prevention Education  Suicide Prevention Education:  Education Completed; with sister, Demante Breining [phone]) has been identified by the patient as the family member/significant other with whom the patient will be residing, and identified as the person(s) who will aid the patient in the event of a mental health crisis (suicidal ideations/suicide attempt).  With written consent from the patient, the family member/significant other has been provided the following suicide prevention education, prior to the and/or following the discharge of the patient.  The suicide prevention education provided includes the following:  Suicide risk factors  Suicide prevention and interventions  National Suicide Hotline telephone number  [facility] assessment telephone number  [facility] Emergency Assistance 911  [facility] and/or Residential Mobile Crisis Unit telephone number  Request made of family/significant other to:  Remove weapons (e.g., guns, rifles, knives), all items previously/currently identified as safety concern.    Remove drugs/medications (over-the-counter, prescriptions, illicit drugs), all items previously/currently identified as a safety concern.  The family member/significant other verbalizes understanding of the suicide prevention education information provided.  The family member/significant other agrees to remove the items of safety concern listed above.  Patient's sister reports that the patient has agreed to discharge home with his parents, here in [place], [place]. Patient's sister states that she and the patient's parents do not agree with the patient returning to [place], and would like to for him to remain in [place] with them.   Patient's sister states that she is concerned that the patient is not being forthcoming and truthful. She states that the patient knows what to say and that he continues to  be focused on a tele-interview he has in [place]. She also shared her concerns for the patient returning to [place], stating that he will be triggered if he returns there. Patient's sister asked to be informed if there were any major changes in the patient's treatment plan or discharge planning.    Maeola Sarah 08/13/2018, 1:16 PM

## 2018-08-13 NOTE — Progress Notes (Signed)
[facility] MD Progress Note  08/13/2018 11:33 AM Wyatt Ward  MRN:  [id number] Subjective:  Patient is a 28 year old male with a past psychiatric history significant for major depression who presented to the [facility] emergency department on 08/12/2018 with suicidal ideation.   Objective: Patient is seen and examined.  Patient is a 28 year old male with a past psychiatric history significant for depression and anxiety, but concerning for possible bipolar disorder.  He is seen in follow-up.  He is a little bit calmer today.  Did sleep last night with the Seroquel.  He still is a bit pressured.  He still psychomotor agitated.  It is difficult at this point to assess whether it is due to anxiety or lability.  He has excepted staying in the hospital a little longer.  He denied any side effects to his current medications.  He denied any suicidal ideation.  He continues to have mild tachycardia and elevated blood pressure.  He did sleep well last night with the trazodone and the Seroquel.  He did state that he talk to his sister, and he thinks that she will discuss the situation with his parents.  Principal Problem: Bipolar 2 disorder (HCC) Diagnosis: Principal Problem:   Bipolar 2 disorder (HCC) Active Problems:   Generalized anxiety disorder   Alcohol abuse  Total Time spent with patient: 15 minutes  Past Psychiatric History: See admission H&P  Past Medical History:  Past Medical History:  Diagnosis Date  . Bipolar 2 disorder (HCC)    History reviewed. No pertinent surgical history. Family History: History reviewed. No pertinent family history. Family Psychiatric  History: See admission H&P Social History:  Social History   Substance and Sexual Activity  Alcohol Use Yes     Social History   Substance and Sexual Activity  Drug Use Never    Social History   Socioeconomic History  . Marital status: Single    Spouse name: Not on file  . Number of children: Not on file   . Years of education: Not on file  . Highest education level: Not on file  Occupational History  . Not on file  Social Needs  . Financial resource strain: Not on file  . Food insecurity:    Worry: Not on file    Inability: Not on file  . Transportation needs:    Medical: Not on file    Non-medical: Not on file  Tobacco Use  . Smoking status: Never Smoker  . Smokeless tobacco: Never Used  Substance and Sexual Activity  . Alcohol use: Yes  . Drug use: Never  . Sexual activity: Not on file  Lifestyle  . Physical activity:    Days per week: Not on file    Minutes per session: Not on file  . Stress: Not on file  Relationships  . Social connections:    Talks on phone: Not on file    Gets together: Not on file    Attends religious service: Not on file    Active member of club or organization: Not on file    Attends meetings of clubs or organizations: Not on file    Relationship status: Not on file  Other Topics Concern  . Not on file  Social History Narrative  . Not on file   Additional Social History:    Pain Medications: Denies use Prescriptions: Denies abuse Over the Counter: Pt taking OTC sleeping medications and Melatonin History of alcohol / drug use?: Yes Longest period of sobriety (  when/how long): Unknown Name of Substance 1: Alcohol 1 - Age of First Use: unknown 1 - Amount (size/oz): Approximately one bottle of wine 1 - Frequency: 3-4 times per week 1 - Duration: Ongoing 1 - Last Use / Amount: 08/11/18                  Sleep: Good  Appetite:  Fair  Current Medications: Current Facility-Administered Medications  Medication Dose Route Frequency Provider Last Rate Last Dose  . acetaminophen (TYLENOL) tablet 650 mg  650 mg Oral Q6H PRN Antonieta Pert, MD      . alum & mag hydroxide-simeth (MAALOX/MYLANTA) 200-200-20 MG/5ML suspension 30 mL  30 mL Oral Q4H PRN Antonieta Pert, MD      . clonazePAM Scarlette Calico) tablet 0.5 mg  0.5 mg Oral TID PRN  Antonieta Pert, MD   0.5 mg at 08/12/18 2056  . DULoxetine (CYMBALTA) DR capsule 60 mg  60 mg Oral Daily Antonieta Pert, MD   60 mg at 08/13/18 1610  . hydrOXYzine (ATARAX/VISTARIL) tablet 25 mg  25 mg Oral TID PRN Antonieta Pert, MD   25 mg at 08/13/18 0739  . loperamide (IMODIUM) capsule 2-4 mg  2-4 mg Oral PRN Antonieta Pert, MD      . magnesium hydroxide (MILK OF MAGNESIA) suspension 30 mL  30 mL Oral Daily PRN Antonieta Pert, MD      . multivitamin with minerals tablet 1 tablet  1 tablet Oral Daily Antonieta Pert, MD   1 tablet at 08/13/18 [phone]  . ondansetron (ZOFRAN-ODT) disintegrating tablet 4 mg  4 mg Oral Q6H PRN Antonieta Pert, MD      . OXcarbazepine (TRILEPTAL) tablet 75 mg  75 mg Oral BID Antonieta Pert, MD   75 mg at 08/13/18 0936  . QUEtiapine (SEROQUEL) tablet 25 mg  25 mg Oral QHS Antonieta Pert, MD   25 mg at 08/12/18 2056    Lab Results:  Results for orders placed or performed during the hospital encounter of 08/12/18 (from the past 48 hour(s))  TSH     Status: None   Collection Time: 08/12/18  6:15 PM  Result Value Ref Range   TSH 2.551 0.350 - 4.500 uIU/mL    Comment: Performed by a 3rd Generation assay with a functional sensitivity of <=0.01 uIU/mL. Performed at [facility], [address] [place], [place] [address]   Hemoglobin A1c     Status: Abnormal   Collection Time: 08/13/18  6:34 AM  Result Value Ref Range   Hgb A1c MFr Bld 4.6 (L) 4.8 - 5.6 %    Comment: (NOTE) Pre diabetes:          5.7%-6.4% Diabetes:              >6.4% Glycemic control for   <7.0% adults with diabetes    Mean Plasma Glucose 85.32 mg/dL    Comment: Performed at [facility], [address], [place], [place] [address]  Lipid panel     Status: Abnormal   Collection Time: 08/13/18  6:34 AM  Result Value Ref Range   Cholesterol 203 (H) 0 - 200 mg/dL   Triglycerides 69 <782 mg/dL   HDL 91 >95 mg/dL   Total CHOL/HDL Ratio  2.2 RATIO   VLDL 14 0 - 40 mg/dL   LDL Cholesterol 98 0 - 99 mg/dL    Comment:        Total  Cholesterol/HDL:CHD Risk Coronary Heart Disease Risk Table                     Men   Women  1/2 Average Risk   3.4   3.3  Average Risk       5.0   4.4  2 X Average Risk   9.6   7.1  3 X Average Risk  23.4   11.0        Use the calculated Patient Ratio above and the CHD Risk Table to determine the patient's CHD Risk.        ATP III CLASSIFICATION (LDL):  <100     mg/dL   Optimal  174-081  mg/dL   Near or Above                    Optimal  130-159  mg/dL   Borderline  448-185  mg/dL   High  >631     mg/dL   Very High Performed at [facility], [address], [place], [place] [address]     Blood Alcohol level:  Lab Results  Component Value Date   ETH [facility]) 08/12/2018    Metabolic Disorder Labs: Lab Results  Component Value Date   HGBA1C 4.6 (L) 08/13/2018   MPG 85.32 08/13/2018   No results found for: PROLACTIN Lab Results  Component Value Date   CHOL 203 (H) 08/13/2018   TRIG 69 08/13/2018   HDL 91 08/13/2018   CHOLHDL 2.2 08/13/2018   VLDL 14 08/13/2018   LDLCALC 98 08/13/2018    Physical Findings: AIMS: Facial and Oral Movements Muscles of Facial Expression: None, normal Lips and Perioral Area: None, normal Jaw: None, normal Tongue: None, normal,Extremity Movements Upper (arms, wrists, hands, fingers): None, normal Lower (legs, knees, ankles, toes): None, normal, Trunk Movements Neck, shoulders, hips: None, normal, Overall Severity Severity of abnormal movements (highest score from questions above): None, normal Incapacitation due to abnormal movements: None, normal Patient's awareness of abnormal movements (rate only patient's report): No Awareness, Dental Status Current problems with teeth and/or dentures?: No Does patient usually wear dentures?: No  CIWA:  CIWA-Ar Total: 7 COWS:  COWS Total Score: 2  Musculoskeletal: Strength & Muscle  Tone: within normal limits Gait & Station: normal Patient leans: N/A  Psychiatric Specialty Exam: Physical Exam  Nursing note and vitals reviewed. Constitutional: He is oriented to person, place, and time. He appears well-developed and well-nourished.  HENT:  Head: Normocephalic and atraumatic.  Respiratory: Effort normal.  Neurological: He is alert and oriented to person, place, and time.    ROS  Blood pressure (!) 150/110, pulse 96, temperature 97.6 F (36.4 C), temperature source Oral, resp. rate 16, height 5\' 9"  (1.753 m), weight 68 kg, SpO2 100 %.Body mass index is 22.15 kg/m.  General Appearance: Casual  Eye Contact:  Good  Speech:  Pressured  Volume:  Normal  Mood:  Anxious and Depressed  Affect:  Congruent  Thought Process:  Coherent and Descriptions of Associations: Intact  Orientation:  Full (Time, Place, and Person)  Thought Content:  Logical  Suicidal Thoughts:  No  Homicidal Thoughts:  No  Memory:  Immediate;   Fair Recent;   Fair Remote;   Fair  Judgement:  Intact  Insight:  Lacking  Psychomotor Activity:  Increased  Concentration:  Concentration: Fair and Attention Span: Fair  Recall:  [employer] of Knowledge:  Fair  Language:  Good  Akathisia:  Negative  Handed:  Right  AIMS (if indicated):     Assets:  Desire for Improvement Leisure Time Physical Health Resilience  ADL's:  Intact  Cognition:  WNL  Sleep:  Number of Hours: 6.25     Treatment Plan Summary: Daily contact with patient to assess and evaluate symptoms and progress in treatment, Medication management and Plan : Patient is seen and examined.  Patient is a 28 year old male with the above-stated past psychiatric history seen in follow-up. I am still quite concerned about the possibility of bipolar disorder.  We discussed that this morning.  I am going to add Trileptal 75 mg p.o. twice daily for anxiety and mood stability.  We will continue the Cymbalta 60 mg p.o. daily as well as the  clonazepam 0.5 mg p.o. 3 times daily as needed anxiety.  We will continue the Seroquel 25 mg p.o. nightly.  He is considering discussing his admission with his family, and I hope he does.  His blood pressure remains elevated, and much that may be related to anxiety, so I am going to put on propranolol 10 mg p.o. 3 times daily to see if that will decrease his heart rate as well as help his anxiety. 1.  Continue clonazepam 0.5 mg p.o. 3 times daily as needed anxiety. 2.  Continue Cymbalta 60 mg p.o. daily for anxiety and mood. 3.  Continue hydroxyzine 25 mg p.o. 3 times daily as needed anxiety. 4.  Start Trileptal 75 mg p.o. twice daily for anxiety and mood stability. 5.  Continue Seroquel 25 mg p.o. nightly for mood stability. 6.  Start propranolol 10 mg p.o. 3 times daily for hypertension, anxiety and tachycardia. 7.  Disposition planning-in progress.  Antonieta Pert, MD 08/13/2018, 11:33 AM

## 2018-08-13 NOTE — Progress Notes (Signed)
Recreation Therapy Notes  Date:  3.2.20 Time: 0930 Location: [address] Dayroom  Group Topic: Stress Management  Goal Area(s) Addresses:  Patient will identify positive stress management techniques. Patient will identify benefits of using stress management post d/c.  Intervention: Stress Management  Activity :  Meditation.  LRT introduced the stress management technique of meditation.  LRT played a meditation that focused on having a productive and meaningful day.  Patients were to listen and follow along as meditation played.  Education:  Stress Management, Discharge Planning.   Education Outcome: Acknowledges Education  Clinical Observations/Feedback: Pt did not attend group.     Caroll Rancher, LRT/CTRS         Caroll Rancher A 08/13/2018 11:27 AM

## 2018-08-13 NOTE — [facility] Counselor (Signed)
Adult Comprehensive Assessment  Patient ID: Wyatt Ward, male   DOB: 08-06-1990, 28 y.o.   MRN: [id number]  Information Source: Information source: Patient  Current Stressors:  Patient states their primary concerns and needs for treatment are:: "My depression from my girlfriend breaking up with me this past Saturday"  Patient states their goals for this hospitilization and ongoing recovery are:: "I want to get better and be able to leave"  Educational / Learning stressors: N/A  Employment / Job issues: Employed; Patient denies any current stressors  Family Relationships: Patient reports having a strained relationship with his older sister due to her "lack of understanding" in regards to his reasoning for wanting to return to [place], [place].  Financial / Lack of resources (include bankruptcy): Patient reports his finances are currently strained. He reports he is currently in debt.  Housing / Lack of housing: Patient reports living with his close friend's parents prior to coming to the hospital, however he plans to discharge home with his parents here in [place], [place] Physical health (include injuries & life threatening diseases): Patient denies any current stressors  Social relationships: Patient reports his girlfriend recently broke up with him this past Saturday. The patient reports being "shocked" due to him understanding that they were on a "break", however he reports his girlfriend contacted him and told him that their relationship had ended.  Substance abuse: Patient denies any current stressors  Bereavement / Loss: Patient reports he continues to struggle with the recent break up from his girlfriend.   Living/Environment/Situation:  Living Arrangements: Non-relatives/Friends, Other (Comment)(Patient was living with a close friend's parents prior to this hospitalization. ) Living conditions (as described by patient or guardian): "It was a very nice and big home"  Who else lives in the  home?: Close friend's parents  How long has patient lived in current situation?: 2 months  What is atmosphere in current home: Comfortable, Supportive  Family History:  Marital status: Single Are you sexually active?: No What is your sexual orientation?: Heterosexual  Has your sexual activity been affected by drugs, alcohol, medication, or emotional stress?: No Does patient have children?: No  Childhood History:  By whom was/is the patient raised?: Both parents Description of patient's relationship with caregiver when they were a child: Patient reports having a good relationship with his parents during his childhood.  Patient's description of current relationship with people who raised him/her: Patient reports having a good relationship with his parents, however he states he does not know how that relationship will be once he is discharged from the hospital.  How were you disciplined when you got in trouble as a child/adolescent?: "My parents were very strict and authoritative"  Does patient have siblings?: Yes Number of Siblings: 2 Description of patient's current relationship with siblings: Patient reports having a strained relationship with his oldest sister currently, however he and his younger brother have a good relationship. Patient states that he and his sister typically have a good relationship,however she recently shared this hospitalization with his parents and he did not like that.  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)  How long has patient been employed?: 1 week  Patient's job has been impacted by current illness: No What is the longest time patient has a held a job?: 2 years Where was the patient employed at that time?: [employer] as a Arts development officer  Did You Receive Any Psychiatric Treatment/Services While in [employer]?: No Are There Guns or Other Weapons in Your Home?: No  Financial Resources:   Financial resources: Income from employment, No income Does patient have a Lawyer or guardian?: No  Alcohol/Substance Abuse:   What has been your use of drugs/alcohol within the last 12 months?: Patient denies any current stressors  If attempted suicide, did drugs/alcohol play a role in this?: No Alcohol/Substance Abuse Treatment Hx: Denies past history Has alcohol/substance abuse ever caused legal problems?: No  Social Support System:   Patient's Community Support System: Good Describe Community Support System: "My friends"  Type of faith/religion: Atheist  How does patient's faith help to cope with current illness?: N/A   Leisure/Recreation:   Leisure and Hobbies: Event organiser, music, writing and playing soccer"   Strengths/Needs:   What is the patient's perception of their strengths?: "I'm a clear thinker, I'm kind and I am realistic"  Patient states they can use these personal strengths during their treatment to contribute to their recovery: Yes  Patient states these barriers may affect/interfere with their treatment: Yes, patient reports he would like to contact his girlfriend but he knows he should not  Patient states these barriers may affect their return to the community: No  Other important information patient would like considered in planning for their treatment: No   Discharge Plan:   Currently  receiving community mental health services: Yes (From Whom)([facility]) Patient states concerns and preferences for aftercare planning are: Patient reports he would like to continue to follow up with [facility] for outpatient services  Patient states they will know when they are safe and ready for discharge when: Yes, patient reports he is ready for discharge  Does patient have access to transportation?: Yes Does patient have financial barriers related to discharge medications?: Yes Patient description of barriers related to discharge medications: Limited income  Plan for living situation after discharge: Patient is discharging home with his parents Will patient be returning to same living situation after discharge?: No  Summary/Recommendations:   Summary and Recommendations (to be completed by the evaluator): Wyatt Ward is a 28 year old male who is diagnosed with Bipolar II disorder. He presented to the hospital seeking treatment for worsening depression and suicidal ideation. During the assessment, Wyatt Ward was pleasant and cooperative, however he was tearful throughout the assessment. Wyatt Ward states that he has struggled with depression for "a while" however after his girlfriend broke up with him he reports he became suicidal. Wyatt Ward states that he would like to discharge so he can prepare to return to [place], however his family does not agree with him going back. Wyatt Ward states that he would like to have his medications stabilized so that he can focus on his next plan of action. Wyatt Ward follows up with [facility] for outpatient medication management and therapy services. Wyatt Ward can benefit from crisis stabilization, medication management, therapeutic milieu and referral services.   Maeola Sarah. 08/13/2018

## 2018-08-14 DIAGNOSIS — F3181 Bipolar II disorder: Principal | ICD-10-CM

## 2018-08-14 NOTE — Plan of Care (Signed)
  Problem: Activity: Goal: Interest or engagement in leisure activities will improve Outcome: Progressing   Problem: Coping: Goal: Coping ability will improve Outcome: Progressing Goal: Will verbalize feelings Outcome: Progressing   Problem: Health Behavior/Discharge Planning: Goal: Compliance with therapeutic regimen will improve Outcome: Progressing

## 2018-08-14 NOTE — Progress Notes (Signed)
[facility] MD Progress Note  08/14/2018 12:56 PM Hilberto Burzynski  MRN:  [id number] Subjective:  Patient reports significant improvement compared to how he felt prior to admission .  At this time denies any suicidal ideations and is focused on discharging soon /future oriented . States he feels supported by his family, who have come to visit him on unit . Denies current medication side effects .   Objective: I have reviewed case with treatment team and have met with patient .  28 year old male, presented to the hospital on March 1 with suicidal ideations.  Describes significant recent stressors, including a recent break-up with girlfriend and unemployment.  He had recently relocated from [place] where he had been living with girlfriend to the [place] area, at this point living with his parents.  He also reports prior/recent diagnoses of bipolar disorder.  On day of admission had been drinking alcohol heavily, BAL 334 on March 1, but denies pattern of alcohol abuse or dependence. Today describes feeling significantly better than he did on admission, focused on discharging soon, with a plan of staying with his parents for another week or 2 and then relocating back to [place] , where he hopes he will get a job in the near future. Currently does not endorse medication side effects.  At this time he is on Cymbalta, Klonopin as needed, low-dose Seroquel at nightly, and Trileptal. No disruptive or agitated behaviors on unit, pleasant/slightly anxious on approach. With his express consent , I contacted his father ( Hilmo [phone] via phone ) for collateral information . No answer- will retry at a later time.  Principal Problem: Bipolar 2 disorder (H[facility]) Diagnosis: Principal Problem:   Bipolar 2 disorder ([place]) Active Problems:   Alcohol abuse   Generalized anxiety disorder  Total Time spent with patient: 20 minutes  Past Psychiatric History: See admission H&P  Past Medical History:  Past Medical  History:  Diagnosis Date  . Bipolar 2 disorder ([place])    History reviewed. No pertinent surgical history. Family History: History reviewed. No pertinent family history. Family Psychiatric  History: See admission H&P Social History:  Social History   Substance and Sexual Activity  Alcohol Use Yes     Social History   Substance and Sexual Activity  Drug Use Never    Social History   Socioeconomic History  . Marital status: Single    Spouse name: Not on file  . Number of children: Not on file  . Years of education: Not on file  . Highest education level: Not on file  Occupational History  . Not on file  Social Needs  . Financial resource strain: Not on file  . Food insecurity:    Worry: Not on file    Inability: Not on file  . Transportation needs:    Medical: Not on file    Non-medical: Not on file  Tobacco Use  . Smoking status: Never Smoker  . Smokeless tobacco: Never Used  Substance and Sexual Activity  . Alcohol use: Yes  . Drug use: Never  . Sexual activity: Not on file  Lifestyle  . Physical activity:    Days per week: Not on file    Minutes per session: Not on file  . Stress: Not on file  Relationships  . Social connections:    Talks on phone: Not on file    Gets together: Not on file    Attends religious service: Not on file    Active member of club or  organization: Not on file    Attends meetings of clubs or organizations: Not on file    Relationship status: Not on file  Other Topics Concern  . Not on file  Social History Narrative  . Not on file   Additional Social History:    Pain Medications: Denies use Prescriptions: Denies abuse Over the Counter: Pt taking OTC sleeping medications and Melatonin History of alcohol / drug use?: Yes Longest period of sobriety (when/how long): Unknown Name of Substance 1: Alcohol 1 - Age of First Use: unknown 1 - Amount (size/oz): Approximately one bottle of wine 1 - Frequency: 3-4 times per week 1 -  Duration: Ongoing 1 - Last Use / Amount: 08/11/18  Sleep: Good  Appetite:  Good  Current Medications: Current Facility-Administered Medications  Medication Dose Route Frequency Provider Last Rate Last Dose  . acetaminophen (TYLENOL) tablet 650 mg  650 mg Oral Q6H PRN Sharma Covert, MD      . alum & mag hydroxide-simeth (MAALOX/MYLANTA) 200-200-20 MG/5ML suspension 30 mL  30 mL Oral Q4H PRN Sharma Covert, MD      . clonazePAM Bobbye [place]) tablet 1 mg  1 mg Oral TID PRN Sharma Covert, MD   1 mg at 08/14/18 1047  . DULoxetine (CYMBALTA) DR capsule 60 mg  60 mg Oral Daily Sharma Covert, MD   60 mg at 08/14/18 8185  . hydrOXYzine (ATARAX/VISTARIL) tablet 25 mg  25 mg Oral TID PRN Sharma Covert, MD   25 mg at 08/13/18 0739  . loperamide (IMODIUM) capsule 2-4 mg  2-4 mg Oral PRN Sharma Covert, MD      . magnesium hydroxide (MILK OF MAGNESIA) suspension 30 mL  30 mL Oral Daily PRN Sharma Covert, MD      . multivitamin with minerals tablet 1 tablet  1 tablet Oral Daily Sharma Covert, MD   1 tablet at 08/14/18 0739  . ondansetron (ZOFRAN-ODT) disintegrating tablet 4 mg  4 mg Oral Q6H PRN Sharma Covert, MD      . OXcarbazepine (TRILEPTAL) tablet 75 mg  75 mg Oral BID Sharma Covert, MD   75 mg at 08/14/18 0739  . propranolol (INDERAL) tablet 10 mg  10 mg Oral TID Sharma Covert, MD   10 mg at 08/14/18 1156  . QUEtiapine (SEROQUEL) tablet 25 mg  25 mg Oral QHS Sharma Covert, MD   25 mg at 08/13/18 2010    Lab Results:  Results for orders placed or performed during the hospital encounter of 08/12/18 (from the past 48 hour(s))  TSH     Status: None   Collection Time: 08/12/18  6:15 PM  Result Value Ref Range   TSH 2.551 0.350 - 4.500 uIU/mL    Comment: Performed by a 3rd Generation assay with a functional sensitivity of <=0.01 uIU/mL. Performed at [facility], [place] [address] [place], [place] [address]   Hemoglobin A1c      Status: Abnormal   Collection Time: 08/13/18  6:34 AM  Result Value Ref Range   Hgb A1c MFr Bld 4.6 (L) 4.8 - 5.6 %    Comment: (NOTE) Pre diabetes:          5.7%-6.4% Diabetes:              >6.4% Glycemic control for   <7.0% adults with diabetes    Mean Plasma Glucose 85.32 mg/dL    Comment: Performed at [facility], [address] [place], [place] [address]  Lipid panel     Status: Abnormal   Collection Time: 08/13/18  6:34 AM  Result Value Ref Range   Cholesterol 203 (H) 0 - 200 mg/dL   Triglycerides 69 <150 mg/dL   HDL 91 >40 mg/dL   Total CHOL/HDL Ratio 2.2 RATIO   VLDL 14 0 - 40 mg/dL   LDL Cholesterol 98 0 - 99 mg/dL    Comment:        Total Cholesterol/HDL:CHD Risk Coronary Heart Disease Risk Table                     Men   Women  1/2 Average Risk   3.4   3.3  Average Risk       5.0   4.4  2 X Average Risk   9.6   7.1  3 X Average Risk  23.4   11.0        Use the calculated Patient Ratio above and the CHD Risk Table to determine the patient's CHD Risk.        ATP III CLASSIFICATION (LDL):  <100     mg/dL   Optimal  100-129  mg/dL   Near or Above                    Optimal  130-159  mg/dL   Borderline  160-189  mg/dL   High  >190     mg/dL   Very High Performed at [place] [address], [place], [place] [address]     Blood Alcohol level:  Lab Results  Component Value Date   ETH [facility]) 72/62/0355    Metabolic Disorder Labs: Lab Results  Component Value Date   HGBA1C 4.6 (L) 08/13/2018   MPG 85.32 08/13/2018   No results found for: PROLACTIN Lab Results  Component Value Date   CHOL 203 (H) 08/13/2018   TRIG 69 08/13/2018   HDL 91 08/13/2018   CHOLHDL 2.2 08/13/2018   VLDL 14 08/13/2018   LDLCALC 98 08/13/2018    Physical Findings: AIMS: Facial and Oral Movements Muscles of Facial Expression: None, normal Lips and Perioral Area: None, normal Jaw: None, normal Tongue: None, normal,Extremity  Movements Upper (arms, wrists, hands, fingers): None, normal Lower (legs, knees, ankles, toes): None, normal, Trunk Movements Neck, shoulders, hips: None, normal, Overall Severity Severity of abnormal movements (highest score from questions above): None, normal Incapacitation due to abnormal movements: None, normal Patient's awareness of abnormal movements (rate only patient's report): No Awareness, Dental Status Current problems with teeth and/or dentures?: No Does patient usually wear dentures?: No  CIWA:  CIWA-Ar Total: 0 COWS:  COWS Total Score: 5  Musculoskeletal: Strength & Muscle Tone: within normal limits Gait & Station: normal Patient leans: N/A  Psychiatric Specialty Exam: Physical Exam  Nursing note and vitals reviewed. Constitutional: He is oriented to person, place, and time. He appears well-developed and well-nourished.  HENT:  Head: Normocephalic and atraumatic.  Respiratory: Effort normal.  Neurological: He is alert and oriented to person, place, and time.    ROS no headache, no chest pain, no shortness of breath, no vomiting  Blood pressure (!) 123/93, pulse 93, temperature 98.7 F (37.1 C), temperature source Oral, resp. rate 18, height _0  (1.753 m), weight 68 kg, SpO2 100 %.Body mass index is 22.15 kg/m.  General Appearance: Casual  Eye Contact:  Good  Speech:  Normal Rate  Volume:  Normal  Mood:  Currently minimizes depression, states he feels better than on admission  Affect:  Reactive, anxious  Thought Process:  Linear and Descriptions of Associations: Intact  Orientation:  Full (Time, Place, and Person)  Thought Content:  No hallucinations, no delusions expressed  Suicidal Thoughts:  No-denies suicidal or self-injurious ideations at this time, contracts for safety on unit, denies homicidal or violent ideations  Homicidal Thoughts:  No  Memory:  Recent and remote grossly intact  Judgement:  Other:  Fair/improving  Insight:  Fair/improving   Psychomotor Activity:  Normal  Concentration:  Concentration: Good and Attention Span: Good  Recall:  Good  Fund of Knowledge:  Good  Language:  Good  Akathisia:  Negative  Handed:  Right  AIMS (if indicated):     Assets:  Desire for Improvement Leisure Time Physical Health Resilience  ADL's:  Intact  Cognition:  WNL  Sleep:  Number of Hours: 6.75   Assessment:  28 year old male, presented to the hospital on March 1 with suicidal ideations.  Describes significant recent stressors, including a recent break-up with girlfriend and unemployment.  He had recently relocated from [place] where he had been living with girlfriend to the [place] area, at this point living with his parents.  He also reports prior/recent diagnoses of bipolar disorder.  On day of admission had been drinking alcohol heavily, BAL 334 on March 1, but denies pattern of alcohol abuse or dependence.  Today patient describes improving mood, denies any suicidal ideations, presents moderately anxious,  future oriented and hopeful for discharge soon, with a plan of spending some time with his parents before relocating back to [place].  At this time tolerating medications well.   Treatment Plan Summary: Treatment plan reviewed as below today 3/3 Encourage group and milieu participation to work on coping skills and symptom reduction Treatment team working on disposition planning options Continue Seroquel 25 mg nightly for mood disorder/insomnia Continue Trileptal 75 mg twice daily for mood disorder Continue Cymbalta 60 mg daily for anxiety/depression Continue Klonopin 1 mg 3 times daily on a as needed basis for anxiety Continue Inderal 10 mg 3 times daily for anxiety    Jenne Campus, MD 08/14/2018, 12:56 PM   Patient ID: Illa Level, male   DOB: 08/16/1990, 28 y.o.   MRN: [id number]

## 2018-08-14 NOTE — Progress Notes (Signed)
Patient ID: Wyatt Ward, male   DOB: [birth year], 28 y.o.   MRN: [id number]  Nursing Progress Note [id number]  On initial approach, patient is seen sitting up in the dayroom with peers. Patient presents pleasant and cooperative but does appear anxious. Patient compliant with scheduled medications. Patient is seen attending groups and visible in the milieu. Patient currently denies/ SI/HI/AVH. Patient reports, "I was really surprised at how supportive my family was last night. I really wish I had come to them sooner". Patient reports, "I'm upset with my sister but I know she cares". Patient states he hopes to discharge soon and reports he will stay with his parents.  Patient is educated about and provided medication per provider's orders. Patient safety maintained with q15 min safety checks and low fall risk precautions. Emotional support given, 1:1 interaction, and active listening provided. Patient encouraged to attend meals, groups, and work on treatment plan and goals. Labs, vital signs and patient behavior monitored throughout shift.   No medication side effects observed. Patient contracts for safety with staff. Patient remains safe on the unit at this time and agrees to come to staff with any issues/concerns. Patient is interacting with peers appropriately on the unit. Will continue to support and monitor.   Patient's self-inventory sheet Rated Energy Level  Normal  Rated Sleep  Good  Rated Appetite  Good  Rated Anxiety (0-10)  1  Rated Hopelessness (0-10)  1  Rated Depression (0-10)  1   Daily Goal  "to write during free time"

## 2018-08-15 MED ORDER — ACETAMINOPHEN 325 MG PO TABS: 650.0000 mg | ORAL_TABLET | Freq: Four times a day (QID) | ORAL | Status: AC | PRN

## 2018-08-15 MED ORDER — CLONAZEPAM 1 MG PO TABS: 1.0000 mg | ORAL_TABLET | Freq: Three times a day (TID) | ORAL | 0 refills | Status: AC | PRN

## 2018-08-15 MED ORDER — PROPRANOLOL HCL 10 MG PO TABS: 10.0000 mg | ORAL_TABLET | Freq: Three times a day (TID) | ORAL | 0 refills | Status: AC

## 2018-08-15 MED ORDER — ADULT MULTIVITAMIN W/MINERALS CH: 1.0000 | ORAL_TABLET | Freq: Every day | ORAL | Status: AC

## 2018-08-15 MED ORDER — DULOXETINE HCL 60 MG PO CPEP: 60.0000 mg | ORAL_CAPSULE | Freq: Every day | ORAL | 0 refills | Status: AC

## 2018-08-15 MED ORDER — QUETIAPINE FUMARATE 25 MG PO TABS: 25.0000 mg | ORAL_TABLET | Freq: Every day | ORAL | 0 refills | Status: AC

## 2018-08-15 MED ORDER — OXCARBAZEPINE 150 MG PO TABS: 75.0000 mg | ORAL_TABLET | Freq: Two times a day (BID) | ORAL | 0 refills | Status: AC

## 2018-08-15 NOTE — Plan of Care (Signed)
Discharge note  Patient verbalizes readiness for discharge. Follow up plan explained, AVS, Transition record and SRA given. Prescriptions and teaching provided. Belongings returned and signed for. Suicide safety plan completed and signed. Patient verbalizes understanding. Patient denies SI/HI and assures this Clinical research associate he will seek assistance should that change. Patient discharged to lobby where mother and sister were waiting.  Problem: Education: Goal: Utilization of techniques to improve thought processes will improve Outcome: Adequate for Discharge Goal: Knowledge of the prescribed therapeutic regimen will improve Outcome: Adequate for Discharge   Problem: Activity: Goal: Interest or engagement in leisure activities will improve Outcome: Adequate for Discharge Goal: Imbalance in normal sleep/wake cycle will improve Outcome: Adequate for Discharge   Problem: Coping: Goal: Coping ability will improve Outcome: Adequate for Discharge Goal: Will verbalize feelings Outcome: Adequate for Discharge   Problem: Health Behavior/Discharge Planning: Goal: Ability to make decisions will improve Outcome: Adequate for Discharge Goal: Compliance with therapeutic regimen will improve Outcome: Adequate for Discharge   Problem: Education: Goal: Ability to make informed decisions regarding treatment will improve Outcome: Adequate for Discharge   Problem: Coping: Goal: Coping ability will improve Outcome: Adequate for Discharge   Problem: Health Behavior/Discharge Planning: Goal: Identification of resources available to assist in meeting health care needs will improve Outcome: Adequate for Discharge   Problem: Medication: Goal: Compliance with prescribed medication regimen will improve Outcome: Adequate for Discharge   Problem: Self-Concept: Goal: Ability to disclose and discuss suicidal ideas will improve Outcome: Adequate for Discharge Goal: Will verbalize positive feelings about  self Outcome: Adequate for Discharge   Problem: Health Behavior/Discharge Planning: Goal: Identification of resources available to assist in meeting health care needs will improve Outcome: Adequate for Discharge

## 2018-08-15 NOTE — BHH Group Notes (Signed)
Occupational Therapy Group Note  Date:  08/15/2018 Time:  11:54 AM  Group Topic/Focus:  Stress Management  Participation Level:  Active  Participation Quality:  Appropriate  Affect:  Flat  Cognitive:  Appropriate  Insight: Improving  Engagement in Group:  Limited  Modes of Intervention:  Activity, Discussion, Education and Socialization  Additional Comments:    S: no subjective mentioned  O: Stress management group completed to use as productive coping strategy, to help mitigate maladaptive coping to integrate in functional BADL/IADL. Stress management tool worksheet discussed to educate on unhealthy vs healthy coping skills to manage stress to improve community integration. Coping strategies taught include: relaxation based- deep breathing, counting to 10, taking a 1 minute vacation, acceptance, stress balls, relaxation audio/video, visual/mental imagery. Positive mental attitude- gratitude, acceptance, cognitive reframing, positive self talk, anger management. Coping skills bingo played with education given on variety of coping skills between bingo calls. Pts encouraged to share experience with various coping skills and share what has worked for them with others. Coloring and relaxation guide handouts given at the end of the session.   A: Pt presents to group with flat affect, minimally engaged this date. Pt appears attentive, following along but did not offer any subjective statements at this time.  P: OT group will be x1 per week while pt inpatient.   Dalphine Handing, MSOT, OTR/L Behavioral Health OT/ Acute Relief OT PHP Office: [phone]  Dalphine Handing 08/15/2018, 11:54 AM

## 2018-08-15 NOTE — Progress Notes (Signed)
  [facility] Adult Case Management Discharge Plan :  Will you be returning to the same living situation after discharge:  No. Patient is discharging home with his parents in [place], [place] At discharge, do you have transportation home?: Yes,  patient reports his mother and sister are picking him up at discharge Do you have the ability to pay for your medications: No.  Release of information consent forms completed and in the chart;  Patient's signature needed at discharge.  Patient to Follow up at: Follow-up Information    [facility] Follow up on 08/20/2018.   Why:  Hospital follow up appointment is Monday, 3/9 at 8:30a.  Please bring your photo ID, proof of insurance, SSN, current medications, and discharge paperwork from this hospitalization.  Contact information: [address] [place] [address] [phone]           Next level of care provider has access to [facility] Link:yes  Safety Planning and Suicide Prevention discussed: Yes,  with the patient's older sister  Have you used any form of tobacco in the last 30 days? (Cigarettes, Smokeless Tobacco, Cigars, and/or Pipes): No  Has patient been referred to the Quitline?: N/A patient is not a smoker  Patient has been referred for addiction treatment: N/A  Maeola Sarah, LCSWA 08/15/2018, 10:35 AM

## 2018-08-15 NOTE — BHH Suicide Risk Assessment (Signed)
[facility] Discharge Suicide Risk Assessment   Principal Problem: Bipolar 2 disorder [facility]) Discharge Diagnoses: Principal Problem:   Bipolar 2 disorder (HCC) Active Problems:   Alcohol abuse   Generalized anxiety disorder   Total Time spent with patient: 30 minutes  Musculoskeletal: Strength & Muscle Tone: within normal limits Gait & Station: normal Patient leans: N/A  Psychiatric Specialty Exam: ROS no headache, no chest pain, no shortness of breath, no nausea, no vomiting  Blood pressure 120/73, pulse 78, temperature 98.7 F (37.1 C), temperature source Oral, resp. rate 16, height 5\' 9"  (1.753 m), weight 68 kg, SpO2 100 %.Body mass index is 22.15 kg/m.  General Appearance: Well Groomed  Eye Contact::  Good  Speech:  Normal Rate409  Volume:  Normal  Mood:  Improved, currently denies feeling depressed, describes mood as 8/10, with 10 being best  Affect:  Appropriate, Full Range and Not irritable or expansive  Thought Process:  Linear and Descriptions of Associations: Intact  Orientation:  Full (Time, Place, and Person)  Thought Content:  No hallucinations, no delusions, not internally preoccupied  Suicidal Thoughts:  No denies suicidal or self-injurious ideations, denies homicidal or violent ideations  Homicidal Thoughts:  No  Memory:  Recent and remote grossly intact  Judgement:  Other:  Improving  Insight:  Improving  Psychomotor Activity:  Normal  Concentration:  Good  Recall:  Good  Fund of Knowledge:Good  Language: Good  Akathisia:  Negative  Handed:  Right  AIMS (if indicated):     Assets:  Communication Skills Desire for Improvement Resilience  Sleep:  Number of Hours: 6.25  Cognition: WNL  ADL's:  Intact   Mental Status Per Nursing Assessment::   On Admission:  NA  Demographic Factors:  28-year-old single male, recently relocated from [place]  Loss Factors: Relationship stressors/break-up, unemployment, financial difficulties  Historical Factors: History  of depression, anxiety.  As per chart prior suspicion of bipolar disorder  Risk Reduction Factors:   Sense of responsibility to family, Living with another person, especially a relative, Positive social support and Positive coping skills or problem solving skills  Continued Clinical Symptoms:  Today patient presents alert, attentive, well related, calm, without psychomotor agitation, speech normal, not pressured.  Mood improved and currently euthymic.  Affect appropriate, reactive, not irritable, no thought disorder, no flight of ideations, no suicidal ideations, future oriented, no homicidal ideations, no psychotic symptoms. Behavior on unit in good control, pleasant on approach.  No medication side effects reported.  We have reviewed side effect profile.  With his expressed consent I spoke with his father yesterday evening, father corroborates that patient is much improved and is in support of discharge  Cognitive Features That Contribute To Risk:  No gross cognitive deficits noted upon discharge. Is alert , attentive, and oriented x 3     Suicide Risk:  Mild:  Suicidal ideation of limited frequency, intensity, duration, and specificity.  There are no identifiable plans, no associated intent, mild dysphoria and related symptoms, good self-control (both objective and subjective assessment), few other risk factors, and identifiable protective factors, including available and accessible social support.  Follow-up Information    [facility] Follow up on 08/20/2018.   Why:  Hospital follow up appointment is Monday, 3/9 at 8:30a.  Please bring your photo ID, proof of insurance, SSN, current medications, and discharge paperwork from this hospitalization.  Contact information: [address] [place] [address] [phone]           Plan Of Care/Follow-up recommendations:  Activity:  as tolerated  Diet:  Regular  Tests:  NA Other:  See below Patient is expressing readiness for discharge,  leaving unit in good spirits, there are no current grounds for involuntary commitment He plans to go live with his parents, whom he describes as very supportive. Follow-up as above.  Craige Cotta, MD 08/15/2018, 9:57 AM

## 2018-08-15 NOTE — Progress Notes (Signed)
D: Patient denies SI, HI or AVH this evening. Patient presents as anxious but pleasant and cooperative.  Pt. States that his day has been "ok" other than his anxiety.  Pt. Is visible in the dayroom interacting with staff and his peers.  Pt. Denies any concerns and has no physical complaints.  A: Patient given emotional support from RN. Patient encouraged to come to staff with concerns and/or questions. Patient's medication routine continued. Patient's orders and plan of care reviewed.   R: Patient remains appropriate and cooperative. Will continue to monitor patient q15 minutes for safety.

## 2018-08-15 NOTE — Discharge Summary (Addendum)
Physician Discharge Summary Note  Patient:  Wyatt Ward is an 28 y.o., male MRN:  [id number] DOB:  23-Aug-1990 Patient phone:  [phone] (home)  Patient address:   [address] [place] [address],  Total Time spent with patient: 15 minutes  Date of Admission:  08/12/2018 Date of Discharge: 08/15/2018  Reason for Admission:  Suicidal ideation  Principal Problem: Bipolar 2 disorder [facility]) Discharge Diagnoses: Principal Problem:   Bipolar 2 disorder ([place]) Active Problems:   Alcohol abuse   Generalized anxiety disorder   Past Psychiatric History: Per admission H&P: Patient has no previous psychiatric admissions.  He has seen a psychiatrist in [place].  He saw 2 people there.  He was previously prescribed Lexapro and Cymbalta.  He had been prescribed Abilify for mood stability, but never took it.  No previous suicide attempts.  No previous DUIs.  Past Medical History:  Past Medical History:  Diagnosis Date  . Bipolar 2 disorder ([place])    History reviewed. No pertinent surgical history. Family History: History reviewed. No pertinent family history. Family Psychiatric  History: Per admission H&P: Patient stated his mother has a history of depression. Social History:  Social History   Substance and Sexual Activity  Alcohol Use Yes     Social History   Substance and Sexual Activity  Drug Use Never    Social History   Socioeconomic History  . Marital status: Single    Spouse name: Not on file  . Number of children: Not on file  . Years of education: Not on file  . Highest education level: Not on file  Occupational History  . Not on file  Social Needs  . Financial resource strain: Not on file  . Food insecurity:    Worry: Not on file    Inability: Not on file  . Transportation needs:    Medical: Not on file    Non-medical: Not on file  Tobacco Use  . Smoking status: Never Smoker  . Smokeless tobacco: Never Used  Substance and Sexual Activity  . Alcohol use:  Yes  . Drug use: Never  . Sexual activity: Not on file  Lifestyle  . Physical activity:    Days per week: Not on file    Minutes per session: Not on file  . Stress: Not on file  Relationships  . Social connections:    Talks on phone: Not on file    Gets together: Not on file    Attends religious service: Not on file    Active member of club or organization: Not on file    Attends meetings of clubs or organizations: Not on file    Relationship status: Not on file  Other Topics Concern  . Not on file  Social History Narrative  . Not on file    Hospital Course:  Per admission H&P 08/12/2018: Patient is a 28 year old male with a past psychiatric history significant for major depression who presented to the [facility] emergency department on 08/12/2018 with suicidal ideation. The patient had recently moved from [place] back to [place] and was staying with the parents of a friend of his from [place]. The patient had fallen on difficult times in [place]. 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 He had been there for approximately 2 years. He stated he enjoyed his job at first, but then  got bored with that for the repetitious notes of it all. He began to have some depression including helplessness, hopelessness and worthlessness. He saw a psychiatrist initially in [place], but switched. He had been given Lexapro early on, and felt as though it helped to some degree. There was a suggestion of augmenting the Lexapro with Abilify. He was very concerned about starting an antipsychotic medication, and was dissatisfied and switched doctors at that time. He was started on Cymbalta but he is unsure of the dose he was on initially. He is currently taking 60 mg a day. He had been dating a male, and at one point had moved in together in  October 2019. Unfortunately about the same time that he moved into live with a girlfriend he lost his job at [employer]. Over the last several months he was unable to get a job, and his anxiety and depression worsen. In approximately December 2019 his girlfriend announced that she was going to break up with him. That he would move out. He arranged to come to [place] to stay with his friend's parents given his lack of employment and resources. He arrived to their home on approximately Super Bowl Sunday in February 2020. He was still taking the Cymbalta at that time. The family he was staying with noticed some mood swings behaviors and that he would go from depressed to euphoric over a day or 2. A time. He stated he had really not had issues with mood swings prior to most recently. They became concerned enough that they took him to the local mental health center. He underwent a evaluation there, and they suggested that he had bipolar disorder type II. They started him on 50 mg of Seroquel at bedtime. He stated that he had never had severe episodes of euphoria, but the the parents of his friend had noticed these things. He denied any previous history of excessive spending, being awake for 2 3 days at a time, or severe increases in goal-directed activity. After he had been told that he had bipolar disorder type II he became significantly despondent over this. His girlfriend had contacted him, and they decided that all they really needed was a break from one another, and they were trying to reconcile. He actually has an interview at [facility] in [place] via video chat on March 2. Because of the upsetting nature of receiving the diagnosis he began to drink excessively in the home. The parents he was staying with are significantly religious, and did not want alcohol in the home. He had not drank excessively until he had met with the people at [facility]. They came to check on him last evening,  and his door was locked. He did not want them to know that he was drinking. They eventually got in the room and found the alcohol and were very upset about this, and took him to the emergency room. He admitted that he had had suicidal thoughts, but it never harmed himself in the past. He stated that things to just gotten worse after he was told he had bipolar disorder. He initially took Seroquel was that prescribed, but the 50 mg dose "knocked me out". He was planning on reducing it to 25 mg. In the emergency room he denied suicidal ideation, but because the nature of the admission the decision was made to transfer him to our facility for evaluation and stabilization. Currently significantly anxious. He is mildly labile. He denied suicidal ideation. He denied any history of DUIs or  other excessive alcohol use or drug use. His parents live in [place], but they are unaware that he is in [place] currently. They are unaware of the fact that he is currently hospitalized. He denied any history of alcohol withdrawal symptoms, and really any excessive alcohol use prior to the last several days. He denied any onset of mood swings or manic type symptoms after starting the Cymbalta.  His symptoms of any form of mania or hypomania started while he was at his friend's parents house.  He is mildly labile, but denies suicidality currently.   Mr. Maiorino was admitted for suicidal ideation. Cymbalta was continued. Seroquel was reduced to 25 mg QHS due to sedation. He appeared labile with pressured speech, psychomotor agitation and anxiety. Trileptal and PRN Klonopin were started. Collateral information was obtained from his father, who denied safety concerns. He is discharging home with his parents. He remained on the [facility] unit for 3 days. He stabilized with medication and therapy. He was discharged on the medications listed below. He has shown improvement with improved mood, affect, sleep, appetite, and  interaction. He denies any SI/HI/AVH and contracts for safety. He agrees to follow up at [facility] (see below). Patient is provided with prescriptions and medication samples upon discharge. His mother and sister are picking him up for discharge home.  Physical Findings: AIMS: Facial and Oral Movements Muscles of Facial Expression: None, normal Lips and Perioral Area: None, normal Jaw: None, normal Tongue: None, normal,Extremity Movements Upper (arms, wrists, hands, fingers): None, normal Lower (legs, knees, ankles, toes): None, normal, Trunk Movements Neck, shoulders, hips: None, normal, Overall Severity Severity of abnormal movements (highest score from questions above): None, normal Incapacitation due to abnormal movements: None, normal Patient's awareness of abnormal movements (rate only patient's report): No Awareness, Dental Status Current problems with teeth and/or dentures?: No Does patient usually wear dentures?: No  CIWA:  CIWA-Ar Total: 0 COWS:  COWS Total Score: 5  Musculoskeletal: Strength & Muscle Tone: within normal limits Gait & Station: normal Patient leans: N/A  Psychiatric Specialty Exam: Physical Exam  Nursing note and vitals reviewed. Constitutional: He is oriented to person, place, and time. He appears well-developed and well-nourished.  Cardiovascular: Normal rate.  Respiratory: Effort normal.  Neurological: He is alert and oriented to person, place, and time.    Review of Systems  Constitutional: Negative.   Psychiatric/Behavioral: Positive for depression and substance abuse (ETOH). Negative for hallucinations, memory loss and suicidal ideas. The patient is not nervous/anxious and does not have insomnia.     Blood pressure 120/73, pulse 78, temperature 98.7 F (37.1 C), temperature source Oral, resp. rate 16, height _0  (1.753 m), weight 68 kg, SpO2 100 %.Body mass index is 22.15 kg/m.  See MD's discharge SRA     Have you used any form of tobacco in  the last 30 days? (Cigarettes, Smokeless Tobacco, Cigars, and/or Pipes): No  Has this patient used any form of tobacco in the last 30 days? (Cigarettes, Smokeless Tobacco, Cigars, and/or Pipes)  No  Blood Alcohol level:  Lab Results  Component Value Date   ETH 334 (HH) 54/65/6812    Metabolic Disorder Labs:  Lab Results  Component Value Date   HGBA1C 4.6 (L) 08/13/2018   MPG 85.32 08/13/2018   No results found for: PROLACTIN Lab Results  Component Value Date   CHOL 203 (H) 08/13/2018   TRIG 69 08/13/2018   HDL 91 08/13/2018   CHOLHDL 2.2 08/13/2018   VLDL 14 08/13/2018  [place] 98 08/13/2018    See Psychiatric Specialty Exam and Suicide Risk Assessment completed by Attending Physician prior to discharge.  Discharge destination:  Home  Is patient on multiple antipsychotic therapies at discharge:  No   Has Patient had three or more failed trials of antipsychotic monotherapy by history:  No  Recommended Plan for Multiple Antipsychotic Therapies: NA  Discharge Instructions    Discharge instructions   Complete by:  As directed    Patient is instructed to take all prescribed medications as recommended. Report any side effects or adverse reactions to your outpatient psychiatrist. Patient is instructed to abstain from alcohol and illegal drugs while on prescription medications. In the event of worsening symptoms, patient is instructed to call the crisis hotline, 911, or go to the nearest emergency department for evaluation and treatment.     Allergies as of 08/15/2018   No Known Allergies     Medication List    TAKE these medications     Indication  acetaminophen 325 MG tablet Commonly known as:  TYLENOL Take 2 tablets (650 mg total) by mouth every 6 (six) hours as needed for mild pain.  Indication:  Pain   clonazePAM 1 MG tablet Commonly known as:  KLONOPIN Take 1 tablet (1 mg total) by mouth 3 (three) times daily as needed (anxiety).  Indication:  Anxiety    DULoxetine 60 MG capsule Commonly known as:  CYMBALTA Take 1 capsule (60 mg total) by mouth daily. For mood Start taking on:  August 16, 2018 What changed:  additional instructions  Indication:  Mood   multivitamin with minerals Tabs tablet Take 1 tablet by mouth daily. Start taking on:  August 16, 2018  Indication:  Supplementation   OXcarbazepine 150 MG tablet Commonly known as:  TRILEPTAL Take 0.5 tablets (75 mg total) by mouth 2 (two) times daily. For mood  Indication:  Mood   propranolol 10 MG tablet Commonly known as:  INDERAL Take 1 tablet (10 mg total) by mouth 3 (three) times daily. For anxiety  Indication:  Anxiety Related to Current Life Problems   QUEtiapine 25 MG tablet Commonly known as:  SEROQUEL Take 1 tablet (25 mg total) by mouth at bedtime. For mood What changed:    medication strength  how much to take  additional instructions  Indication:  Mood      Follow-up Information    [facility] Follow up on 08/20/2018.   Why:  Hospital follow up appointment is Monday, 3/9 at 8:30a.  Please bring your photo ID, proof of insurance, SSN, current medications, and discharge paperwork from this hospitalization.  Contact information: [address] [place]  [address] [phone]           Follow-up recommendations: Activity as tolerated. Diet as recommended by primary care physician. Keep all scheduled follow-up appointments as recommended.   Comments:   Patient is instructed to take all prescribed medications as recommended. Report any side effects or adverse reactions to your outpatient psychiatrist. Patient is instructed to abstain from alcohol and illegal drugs while on prescription medications. In the event of worsening symptoms, patient is instructed to call the crisis hotline, 911, or go to the nearest emergency department for evaluation and treatment.  Signed: Connye Burkitt, NP 08/15/2018, 9:34 AM   Patient seen, Suicide Assessment Completed.   Disposition Plan Reviewed

## 2019-02-02 ENCOUNTER — Other Ambulatory Visit: Payer: Self-pay

## 2019-02-02 DIAGNOSIS — Z20822 Contact with and (suspected) exposure to covid-19: Secondary | ICD-10-CM

## 2019-02-03 LAB — NOVEL CORONAVIRUS, NAA: SARS-CoV-2, NAA: NOT DETECTED
